# Patient Record
Sex: Female | Born: 1935
Health system: Southern US, Community
[De-identification: ages and names within clinical notes are randomized; demographics above are authoritative.]

## PROBLEM LIST (undated history)

## (undated) DIAGNOSIS — E119 Type 2 diabetes mellitus without complications: Secondary | ICD-10-CM

## (undated) DIAGNOSIS — I10 Essential (primary) hypertension: Secondary | ICD-10-CM

## (undated) DIAGNOSIS — E785 Hyperlipidemia, unspecified: Secondary | ICD-10-CM

## (undated) DIAGNOSIS — E669 Obesity, unspecified: Secondary | ICD-10-CM

## (undated) HISTORY — PX: BREAST SURGERY: SHX581

## (undated) HISTORY — PX: APPENDECTOMY: SHX54

## (undated) HISTORY — DX: Essential (primary) hypertension: I10

## (undated) HISTORY — DX: Type 2 diabetes mellitus without complications: E11.9

## (undated) HISTORY — DX: Obesity, unspecified: E66.9

## (undated) HISTORY — PX: CHOLECYSTECTOMY: SHX55

## (undated) HISTORY — DX: Hyperlipidemia, unspecified: E78.5

## (undated) HISTORY — PX: NEPHRECTOMY: SHX65

## (undated) HISTORY — PX: CATARACT EXTRACTION, BILATERAL: SHX1313

## (undated) HISTORY — PX: OOPHORECTOMY: SHX86

---

## 1967-09-16 HISTORY — PX: FRACTURE SURGERY: SHX138

## 1982-09-15 HISTORY — PX: ABDOMINAL HYSTERECTOMY: SHX81

## 1983-09-16 HISTORY — PX: BLADDER SURGERY: SHX569

## 1985-09-15 HISTORY — PX: CARPAL TUNNEL RELEASE: SHX101

## 1998-11-19 ENCOUNTER — Ambulatory Visit (HOSPITAL_COMMUNITY): Admission: RE | Admit: 1998-11-19 | Discharge: 1998-11-19 | Payer: Self-pay | Admitting: Obstetrics & Gynecology

## 2001-01-26 ENCOUNTER — Other Ambulatory Visit: Admission: RE | Admit: 2001-01-26 | Discharge: 2001-01-26 | Payer: Self-pay | Admitting: Family Medicine

## 2002-09-15 HISTORY — PX: JOINT REPLACEMENT: SHX530

## 2004-09-17 ENCOUNTER — Ambulatory Visit: Payer: Self-pay | Admitting: Unknown Physician Specialty

## 2004-12-02 ENCOUNTER — Ambulatory Visit: Payer: Self-pay | Admitting: Family Medicine

## 2005-07-03 ENCOUNTER — Encounter: Payer: Self-pay | Admitting: General Practice

## 2005-07-16 ENCOUNTER — Encounter: Payer: Self-pay | Admitting: General Practice

## 2006-01-12 ENCOUNTER — Ambulatory Visit: Payer: Self-pay | Admitting: General Practice

## 2007-01-25 ENCOUNTER — Ambulatory Visit: Payer: Self-pay | Admitting: Pain Medicine

## 2007-01-26 ENCOUNTER — Ambulatory Visit: Payer: Self-pay | Admitting: Pain Medicine

## 2007-01-27 ENCOUNTER — Ambulatory Visit: Payer: Self-pay | Admitting: Pain Medicine

## 2007-02-09 ENCOUNTER — Ambulatory Visit: Payer: Self-pay | Admitting: Pain Medicine

## 2007-03-09 ENCOUNTER — Ambulatory Visit: Payer: Self-pay | Admitting: Pain Medicine

## 2007-03-24 ENCOUNTER — Ambulatory Visit: Payer: Self-pay | Admitting: Physician Assistant

## 2007-05-06 ENCOUNTER — Ambulatory Visit: Payer: Self-pay | Admitting: Family Medicine

## 2007-05-11 ENCOUNTER — Ambulatory Visit: Payer: Self-pay | Admitting: Internal Medicine

## 2007-08-06 ENCOUNTER — Ambulatory Visit: Payer: Self-pay | Admitting: Family Medicine

## 2007-08-19 ENCOUNTER — Ambulatory Visit: Payer: Self-pay | Admitting: Family Medicine

## 2007-09-01 ENCOUNTER — Ambulatory Visit: Payer: Self-pay | Admitting: Family Medicine

## 2008-06-06 HISTORY — PX: HERNIA REPAIR: SHX51

## 2010-02-26 ENCOUNTER — Ambulatory Visit: Payer: Self-pay | Admitting: Unknown Physician Specialty

## 2010-07-29 ENCOUNTER — Ambulatory Visit: Payer: Self-pay | Admitting: Family Medicine

## 2010-10-16 HISTORY — PX: EYE SURGERY: SHX253

## 2011-01-28 NOTE — Assessment & Plan Note (Signed)
[facility]                             PULMONARY OFFICE NOTE   SERETHA, ESTABROOKS                        MRN:          [id number]  DATE:05/11/2007                            DOB:          1935-12-08    REASON FOR CONSULTATION:  Abnormal chest CT scan and chronic cough.   HISTORY:  A delightful 75 year old white female never smoker with a flu-  like illness in February 2008 and a persistent cough since then.  Initially she had a congested cough with thick mucus for which she  received antibiotics but improved but never completely better following  this illness.  She has been bothered by a persistent dry cough that is  present more during the day than night and is mostly dry.  She is also  bothered by right-sided posterior chest discomfort that is worse with  coughing but also positional in nature and feels like she might have  torn something.  This led to a chest x-ray, which led to a CT scan  showing bilateral infiltrates, and therefore I was asked to see her.   The patient denies any antecedent history of respiratory complaints.  She had been on ACE inhibitor but Dr. Sullivan Lone had already stopped this,  she thinks several months ago.  She denies any previous history of atopy  or asthma or chronic cough following respiratory illnesses, active sinus  or reflux symptoms.   PAST MEDICAL HISTORY:  1. Type 2 diabetes.  2. Remote cholecystectomy, hysterectomy and appendectomy as well as      several abdominal surgeries for benign purposes.   ALLERGIES:  None known.   MEDICATIONS:  Have included ACE inhibitors in the but presently only  include:   1. Ranitidine 150 mg b.i.d.  2. Naproxen 375 mg b.i.d.  3. Aspirin daily.  4. Lipitor.  5. Actos.   For a full inventory of dosing, please see face sheet column dated  May 11, 2007, correct as listed.   SOCIAL HISTORY:  She has never smoked.  She is a retired Occupational hygienist for  [employer].   FAMILY HISTORY:  Significant for cancer of the pancreas in her father  and prostate of her brother.   REVIEW OF SYSTEMS:  Taken in detail on the worksheet and negative except  as outlined above.   PHYSICAL EXAMINATION:  This is an ambulatory, obese white female with  mild voice fatigue.  She has stable vital signs.  HEENT:  Minimum turbinate edema, nonspecific edema.  Oropharynx is  clear.  No evidence of excessive postnasal drainage or cobblestoning.  Ear canals clear bilaterally.  NECK:  Supple without cervical adenopathy or tenderness.  Trachea is  midline.  No thyromegaly.  Lung fields perfectly clear bilaterally to auscultation and percussion.  When she took a deep breath she could feel the urge to cough in the  midportion of her neck at the level of the thyroid cartilage.  Lung  fields perfectly clear bilaterally to auscultation and percussion with  excellent air movement.  There is a regular rhythm without murmur, gallop, or rub.  ABDOMEN:  Obese, benign.  EXTREMITIES:  Warm without calf tenderness, cyanosis, clubbing or edema.   Chest x-ray and CT scans were available.  Chest x-ray shows on August 12  a suggestion of increased density overlying the right middle lobe, which  on CT scan corresponds to increased interstitial densities in the  anterior and medial segments of both the right middle lobe and lingula.  There are no pathologic nodes.   IMPRESSION:  A chronic upper airway cough has developed following a  upper respiratory infection and most likely is not directly related to  the findings on CT scan (see comments below).  That is, this is a dry  daytime cough, not associated with excess sputum production, fevers,  chills, sweats, or unintended weight loss, as might be seen in a right  middle lobe/lingular syndrome, which is most likely what she has.   To sort through the differential diagnosis of chronic cough, however, I  have recommended initially adherence to a  GERD diet along with Zantac  150 mg two q.h.s., omeprazole 40 mg q.a.m., and to suppress the dry,  cyclical cough with Delsym combined with tramadol 50 mg one every 4  hours.   If this eliminates the cough, then all I would do is see if there is  convincing evidence of evolving change on plain x-ray on her return  visit in 4 weeks.  If so, we could schedule her here for bronchoscopy  for lavage to rule out MAI, which would be the most likely explanation  for the above findings (even if she did have MAI, I am not sure it would  indicate that we wound necessarily need to begin treatment unless we  could be convinced of a cause and effect relationship between the  infiltrates and their symptoms).   I explained all this to the best of my ability to this very nice lady  and would be happy to see her back here to schedule bronchoscopy if  there are either persistent symptoms while on the above therapy or if  she has macroscopic evidence of evolving changes on serial chest x-ray.     Charlaine Dalton. Sherene Sires, MD, [facility]  Electronically Signed    MBW/MedQ  DD: 05/11/2007  DT: 05/12/2007  Job #: [id number]   cc:   Julieanne Manson

## 2011-04-02 ENCOUNTER — Ambulatory Visit: Payer: Self-pay | Admitting: Unknown Physician Specialty

## 2011-04-14 ENCOUNTER — Ambulatory Visit: Payer: Self-pay | Admitting: Unknown Physician Specialty

## 2011-09-22 ENCOUNTER — Emergency Department: Payer: Self-pay | Admitting: Emergency Medicine

## 2011-09-22 LAB — COMPREHENSIVE METABOLIC PANEL
Albumin: 4.5 g/dL (ref 3.4–5.0)
Anion Gap: 8 (ref 7–16)
BUN: 34 mg/dL — ABNORMAL HIGH (ref 7–18)
Calcium, Total: 9.4 mg/dL (ref 8.5–10.1)
Chloride: 107 mmol/L (ref 98–107)
Co2: 25 mmol/L (ref 21–32)
EGFR (African American): 34 — ABNORMAL LOW
EGFR (Non-African Amer.): 28 — ABNORMAL LOW
Glucose: 121 mg/dL — ABNORMAL HIGH (ref 65–99)
Osmolality: 288 (ref 275–301)
Potassium: 5 mmol/L (ref 3.5–5.1)
SGOT(AST): 17 U/L (ref 15–37)
Sodium: 140 mmol/L (ref 136–145)
Total Protein: 8.1 g/dL (ref 6.4–8.2)

## 2011-09-22 LAB — CBC
HGB: 12.6 g/dL (ref 12.0–16.0)
MCH: 32.3 pg (ref 26.0–34.0)
Platelet: 276 10*3/uL (ref 150–440)
RBC: 3.9 10*6/uL (ref 3.80–5.20)
RDW: 13.3 % (ref 11.5–14.5)

## 2011-09-22 LAB — PROTIME-INR
INR: 1
Prothrombin Time: 13.6 secs (ref 11.5–14.7)

## 2012-01-02 ENCOUNTER — Ambulatory Visit: Payer: Self-pay | Admitting: Family Medicine

## 2012-06-30 ENCOUNTER — Ambulatory Visit: Payer: Self-pay | Admitting: Family Medicine

## 2012-06-30 LAB — HM DEXA SCAN

## 2013-06-20 ENCOUNTER — Ambulatory Visit: Payer: Self-pay | Admitting: Unknown Physician Specialty

## 2013-06-20 LAB — HM COLONOSCOPY

## 2013-07-25 ENCOUNTER — Ambulatory Visit: Payer: Self-pay | Admitting: Physical Medicine and Rehabilitation

## 2014-01-16 LAB — HM MAMMOGRAPHY

## 2014-05-29 LAB — LIPID PANEL
CHOLESTEROL: 184 mg/dL (ref 0–200)
HDL: 57 mg/dL (ref 35–70)
LDL Cholesterol: 95 mg/dL
LDL/HDL RATIO: 1.7
Triglycerides: 162 mg/dL — AB (ref 40–160)

## 2014-05-29 LAB — CBC AND DIFFERENTIAL
HEMATOCRIT: 39 % (ref 36–46)
Hemoglobin: 13.2 g/dL (ref 12.0–16.0)
Neutrophils Absolute: 7 /uL
Platelets: 364 10*3/uL (ref 150–399)
WBC: 9.9 10*3/mL

## 2014-05-29 LAB — TSH: TSH: 2.82 u[IU]/mL (ref 0.41–5.90)

## 2014-05-29 LAB — BASIC METABOLIC PANEL
BUN: 44 mg/dL — AB (ref 4–21)
CREATININE: 1.9 mg/dL — AB (ref 0.5–1.1)
Glucose: 212 mg/dL
Potassium: 5.3 mmol/L (ref 3.4–5.3)
Sodium: 138 mmol/L (ref 137–147)

## 2014-05-29 LAB — HEPATIC FUNCTION PANEL
ALK PHOS: 65 U/L (ref 25–125)
ALT: 7 U/L (ref 7–35)
AST: 10 U/L — AB (ref 13–35)
Bilirubin, Total: 0.9 mg/dL

## 2015-01-01 LAB — HEMOGLOBIN A1C: Hgb A1c MFr Bld: 6.6 % — AB (ref 4.0–6.0)

## 2015-02-19 DIAGNOSIS — N399 Disorder of urinary system, unspecified: Secondary | ICD-10-CM | POA: Insufficient documentation

## 2015-02-19 DIAGNOSIS — R51 Headache: Secondary | ICD-10-CM

## 2015-02-19 DIAGNOSIS — I8390 Asymptomatic varicose veins of unspecified lower extremity: Secondary | ICD-10-CM | POA: Insufficient documentation

## 2015-02-19 DIAGNOSIS — G510 Bell's palsy: Secondary | ICD-10-CM | POA: Insufficient documentation

## 2015-02-19 DIAGNOSIS — M5136 Other intervertebral disc degeneration, lumbar region: Secondary | ICD-10-CM | POA: Insufficient documentation

## 2015-02-19 DIAGNOSIS — M1990 Unspecified osteoarthritis, unspecified site: Secondary | ICD-10-CM | POA: Insufficient documentation

## 2015-02-19 DIAGNOSIS — I776 Arteritis, unspecified: Secondary | ICD-10-CM | POA: Insufficient documentation

## 2015-02-19 DIAGNOSIS — K1120 Sialoadenitis, unspecified: Secondary | ICD-10-CM | POA: Insufficient documentation

## 2015-02-19 DIAGNOSIS — I872 Venous insufficiency (chronic) (peripheral): Secondary | ICD-10-CM | POA: Insufficient documentation

## 2015-02-19 DIAGNOSIS — C649 Malignant neoplasm of unspecified kidney, except renal pelvis: Secondary | ICD-10-CM | POA: Insufficient documentation

## 2015-02-19 DIAGNOSIS — R519 Headache, unspecified: Secondary | ICD-10-CM | POA: Insufficient documentation

## 2015-02-19 DIAGNOSIS — M48061 Spinal stenosis, lumbar region without neurogenic claudication: Secondary | ICD-10-CM | POA: Insufficient documentation

## 2015-02-19 DIAGNOSIS — I130 Hypertensive heart and chronic kidney disease with heart failure and stage 1 through stage 4 chronic kidney disease, or unspecified chronic kidney disease: Secondary | ICD-10-CM | POA: Insufficient documentation

## 2015-02-19 DIAGNOSIS — E1142 Type 2 diabetes mellitus with diabetic polyneuropathy: Secondary | ICD-10-CM | POA: Insufficient documentation

## 2015-02-19 DIAGNOSIS — R600 Localized edema: Secondary | ICD-10-CM | POA: Insufficient documentation

## 2015-02-19 DIAGNOSIS — E1140 Type 2 diabetes mellitus with diabetic neuropathy, unspecified: Secondary | ICD-10-CM | POA: Insufficient documentation

## 2015-02-19 DIAGNOSIS — Z86010 Personal history of colonic polyps: Secondary | ICD-10-CM | POA: Insufficient documentation

## 2015-02-19 DIAGNOSIS — E559 Vitamin D deficiency, unspecified: Secondary | ICD-10-CM | POA: Insufficient documentation

## 2015-02-19 DIAGNOSIS — M5116 Intervertebral disc disorders with radiculopathy, lumbar region: Secondary | ICD-10-CM | POA: Insufficient documentation

## 2015-02-19 DIAGNOSIS — N289 Disorder of kidney and ureter, unspecified: Secondary | ICD-10-CM | POA: Insufficient documentation

## 2015-02-19 DIAGNOSIS — E039 Hypothyroidism, unspecified: Secondary | ICD-10-CM | POA: Insufficient documentation

## 2015-02-19 DIAGNOSIS — E785 Hyperlipidemia, unspecified: Secondary | ICD-10-CM | POA: Insufficient documentation

## 2015-02-19 DIAGNOSIS — E669 Obesity, unspecified: Secondary | ICD-10-CM | POA: Insufficient documentation

## 2015-02-19 DIAGNOSIS — K219 Gastro-esophageal reflux disease without esophagitis: Secondary | ICD-10-CM | POA: Insufficient documentation

## 2015-02-19 DIAGNOSIS — N6019 Diffuse cystic mastopathy of unspecified breast: Secondary | ICD-10-CM | POA: Insufficient documentation

## 2015-04-30 ENCOUNTER — Other Ambulatory Visit: Payer: Self-pay | Admitting: Family Medicine

## 2015-05-02 ENCOUNTER — Ambulatory Visit: Payer: Self-pay | Admitting: Family Medicine

## 2015-05-07 ENCOUNTER — Other Ambulatory Visit: Payer: Self-pay | Admitting: Physician Assistant

## 2015-05-07 DIAGNOSIS — M75102 Unspecified rotator cuff tear or rupture of left shoulder, not specified as traumatic: Secondary | ICD-10-CM

## 2015-05-09 ENCOUNTER — Ambulatory Visit (INDEPENDENT_AMBULATORY_CARE_PROVIDER_SITE_OTHER): Payer: Medicare Other | Admitting: Family Medicine

## 2015-05-09 ENCOUNTER — Encounter: Payer: Self-pay | Admitting: Family Medicine

## 2015-05-09 VITALS — BP 124/62 | HR 78 | Temp 97.9°F | Resp 16 | Wt 220.0 lb

## 2015-05-09 DIAGNOSIS — N189 Chronic kidney disease, unspecified: Secondary | ICD-10-CM

## 2015-05-09 DIAGNOSIS — E1142 Type 2 diabetes mellitus with diabetic polyneuropathy: Secondary | ICD-10-CM

## 2015-05-09 DIAGNOSIS — E039 Hypothyroidism, unspecified: Secondary | ICD-10-CM | POA: Diagnosis not present

## 2015-05-09 DIAGNOSIS — G629 Polyneuropathy, unspecified: Secondary | ICD-10-CM | POA: Diagnosis not present

## 2015-05-09 DIAGNOSIS — I130 Hypertensive heart and chronic kidney disease with heart failure and stage 1 through stage 4 chronic kidney disease, or unspecified chronic kidney disease: Secondary | ICD-10-CM | POA: Diagnosis not present

## 2015-05-09 DIAGNOSIS — I509 Heart failure, unspecified: Secondary | ICD-10-CM

## 2015-05-09 DIAGNOSIS — E119 Type 2 diabetes mellitus without complications: Secondary | ICD-10-CM | POA: Insufficient documentation

## 2015-05-09 DIAGNOSIS — E1121 Type 2 diabetes mellitus with diabetic nephropathy: Secondary | ICD-10-CM

## 2015-05-09 LAB — POCT GLYCOSYLATED HEMOGLOBIN (HGB A1C): HEMOGLOBIN A1C: 7.2

## 2015-05-09 NOTE — Progress Notes (Signed)
Patient ID: Gina Michael, female   DOB: [birth year], 79 y.o.   MRN: [id number]    Subjective:  HPI  Diabetes Mellitus Type II, Follow-up:   Lab Results  Component Value Date   HGBA1C 6.6* 01/01/2015    Last seen for diabetes 4 months ago.  Management changes included none. She reports good compliance with treatment. She is not having side effects.  Current symptoms include none Home blood sugar records: 116-118 fasting  Episodes of hypoglycemia? no   Current Insulin Regimen: n/a Most Recent Eye Exam Current exercise: yard work and walking about 15 minutes 2 times a day, 5 days a week  Pertinent Labs:    Component Value Date/Time   CHOL 184 05/29/2014   TRIG 162* 05/29/2014   CREATININE 1.9* 05/29/2014   CREATININE 1.85* 09/22/2011 1937    Wt Readings from Last 3 Encounters:  05/09/15 220 lb (99.791 kg)  01/01/15 216 lb (97.977 kg)    ------------------------------------------------------------------------    Hypertension, follow-up:  BP Readings from Last 3 Encounters:  05/09/15 124/62  01/01/15 128/78    She was last seen for hypertension 4 months ago.  BP at that visit was 128/78. Management changes since that visit include none. She reports good compliance with treatment. She is not having side effects.  She is exercising.  Wt Readings from Last 3 Encounters:  05/09/15 220 lb (99.791 kg)  01/01/15 216 lb (97.977 kg)   ------------------------------------------------------------------------     Prior to Admission medications   Medication Sig Start Date End Date Taking? Authorizing Provider  aspirin 81 MG EC tablet Take by mouth. 03/06/11  Yes Historical Provider, MD  atorvastatin (LIPITOR) 10 MG tablet Take by mouth. 12/27/14  Yes Historical Provider, MD  Cholecalciferol 1000 UNITS TBDP Take by mouth.   Yes Historical Provider, MD  Docusate Sodium 100 MG capsule Take by mouth.   Yes Historical Provider, MD  glimepiride (AMARYL) 4 MG tablet Take  by mouth. 02/07/15  Yes Historical Provider, MD  levothyroxine (SYNTHROID, LEVOTHROID) 88 MCG tablet TAKE ONE TABLET EVERY DAY 04/30/15  Yes Jerrol Banana., MD  losartan (COZAAR) 50 MG tablet Take by mouth. 08/14/14  Yes Historical Provider, MD  Omeprazole 20 MG TBEC Take by mouth. 06/11/11  Yes Historical Provider, MD  pioglitazone (ACTOS) 15 MG tablet Take by mouth. 07/19/14  Yes Historical Provider, MD  ranitidine (ZANTAC) 300 MG capsule Take by mouth. 09/12/13  Yes Historical Provider, MD  sodium bicarbonate 650 MG tablet Take by mouth.   Yes Historical Provider, MD    Patient Active Problem List   Diagnosis Date Noted  . Diabetes 05/09/2015  . Bell palsy 02/19/2015  . Chronic venous insufficiency 02/19/2015  . DDD (degenerative disc disease), lumbar 02/19/2015  . Diabetic neuropathy 02/19/2015  . Urinary system disease 02/19/2015  . Edema extremities 02/19/2015  . Bloodgood disease 02/19/2015  . Acid reflux 02/19/2015  . Cephalalgia 02/19/2015  . H/O adenomatous polyp of colon 02/19/2015  . HLD (hyperlipidemia) 02/19/2015  . Heart & renal disease, hypertensive, with heart failure 02/19/2015  . Adult hypothyroidism 02/19/2015  . Neuritis or radiculitis due to rupture of lumbar intervertebral disc 02/19/2015  . Lumbar canal stenosis 02/19/2015  . Cancer of kidney 02/19/2015  . Adiposity 02/19/2015  . Arthritis, degenerative 02/19/2015  . Impaired renal function 02/19/2015  . Sialoadenitis 02/19/2015  . Diabetic peripheral neuropathy associated with type 2 diabetes mellitus 02/19/2015  . Phlebectasia 02/19/2015  . Angiitis 02/19/2015  . Avitaminosis D 02/19/2015  No past medical history on file.  Social History   Social History  . Marital Status: Married    Spouse Name: N/A  . Number of Children: N/A  . Years of Education: N/A   Occupational History  . Not on file.   Social History Main Topics  . Smoking status: Never Smoker   . Smokeless tobacco: Not on  file  . Alcohol Use: No  . Drug Use: No  . Sexual Activity: Not on file   Other Topics Concern  . Not on file   Social History Narrative    Allergies  Allergen Reactions  . Augmentin  [Amoxicillin-Pot Clavulanate] Anaphylaxis  . Cleocin  [Clindamycin] Anaphylaxis  . Penicillins Anaphylaxis  . Cefprozil   . Codeine   . Citric Acid Sodium  [Sodium Citrate]   . Vancomycin Hcl   . Oxycodone Rash    Review of Systems  Constitutional: Negative.   HENT: Negative.   Eyes: Negative.   Respiratory: Negative.   Cardiovascular: Negative.   Gastrointestinal: Negative.   Genitourinary: Negative.   Musculoskeletal: Positive for joint pain.  Skin: Negative.   Neurological: Negative.   Endo/Heme/Allergies: Negative.   Psychiatric/Behavioral: Negative.     Immunization History  Administered Date(s) Administered  . Pneumococcal Conjugate-13 01/30/2014  . Pneumococcal Polysaccharide-23 07/27/2000, 07/23/2005  . Td 08/04/2007  . Zoster 10/31/2008   Objective:  BP 124/62 mmHg  Pulse 78  Temp(Src) 97.9 F (36.6 C) (Oral)  Resp 16  Wt 220 lb (99.791 kg)  Physical Exam  Constitutional: She is oriented to person, place, and time and well-developed, well-nourished, and in no distress.  HENT:  Head: Normocephalic and atraumatic.  Right Ear: External ear normal.  Left Ear: External ear normal.  Nose: Nose normal.  Eyes: Conjunctivae are normal.  Neck: Neck supple.  Cardiovascular: Normal rate, regular rhythm and normal heart sounds.   Pulmonary/Chest: Effort normal and breath sounds normal.  Abdominal: Soft.  Musculoskeletal:  Decreased range of motion left shoulder due to pain  Neurological: She is alert and oriented to person, place, and time.  Skin: Skin is warm and dry.  Psychiatric: Mood, memory, affect and judgment normal.    Lab Results  Component Value Date   WBC 9.9 05/29/2014   HGB 13.2 05/29/2014   HCT 39 05/29/2014   PLT 364 05/29/2014   GLUCOSE 121*  09/22/2011   CHOL 184 05/29/2014   TRIG 162* 05/29/2014   HDL 57 05/29/2014   LDLCALC 95 05/29/2014   TSH 2.82 05/29/2014   INR 1.0 09/22/2011   HGBA1C 6.6* 01/01/2015    CMP     Component Value Date/Time   NA 138 05/29/2014   NA 140 09/22/2011 1937   K 5.3 05/29/2014   K 5.0 09/22/2011 1937   CL 107 09/22/2011 1937   CO2 25 09/22/2011 1937   GLUCOSE 121* 09/22/2011 1937   BUN 44* 05/29/2014   BUN 34* 09/22/2011 1937   CREATININE 1.9* 05/29/2014   CREATININE 1.85* 09/22/2011 1937   CALCIUM 9.4 09/22/2011 1937   PROT 8.1 09/22/2011 1937   ALBUMIN 4.5 09/22/2011 1937   AST 10* 05/29/2014   AST 17 09/22/2011 1937   ALT 7 05/29/2014   ALT 18 09/22/2011 1937   ALKPHOS 65 05/29/2014   ALKPHOS 64 09/22/2011 1937   BILITOT 0.8 09/22/2011 1937   GFRNONAA 28* 09/22/2011 1937   GFRAA 34* 09/22/2011 1937    Assessment and Plan :  Type 2 diabetes Good control with A1c of 7.2  goal A1c of 7.5 now. Told her when she turns 82 goal A1c will be 8. He is having no hypoglycemia. Hypertension Diabetic nephropathy Followed by nephrology Rotator cuff syndrome Seeing orthopedics, MRI this week. Hyperlipidemia Hypothyroidism Obesity I have done the exam and reviewed the above chart and it is accurate to the best of my knowledge.   Miguel Aschoff MD [facility] 05/09/2015 2:46 PM

## 2015-05-11 ENCOUNTER — Ambulatory Visit
Admission: RE | Admit: 2015-05-11 | Discharge: 2015-05-11 | Disposition: A | Discharge status: Home / Self Care | Payer: Medicare Other | Source: Ambulatory Visit | Attending: Physician Assistant | Admitting: Physician Assistant

## 2015-05-11 DIAGNOSIS — M19012 Primary osteoarthritis, left shoulder: Secondary | ICD-10-CM | POA: Insufficient documentation

## 2015-05-11 DIAGNOSIS — M7552 Bursitis of left shoulder: Secondary | ICD-10-CM | POA: Insufficient documentation

## 2015-05-11 DIAGNOSIS — M75102 Unspecified rotator cuff tear or rupture of left shoulder, not specified as traumatic: Secondary | ICD-10-CM

## 2015-05-11 DIAGNOSIS — M7542 Impingement syndrome of left shoulder: Secondary | ICD-10-CM | POA: Diagnosis present

## 2015-05-11 DIAGNOSIS — M7592 Shoulder lesion, unspecified, left shoulder: Secondary | ICD-10-CM | POA: Insufficient documentation

## 2015-06-12 ENCOUNTER — Other Ambulatory Visit: Payer: Self-pay | Admitting: Family Medicine

## 2015-06-12 NOTE — Telephone Encounter (Signed)
Patient seen Dr. Koleen Nimrod for dermatology.  3 years ago she was seen for a rash (which was undetermined exactly what it was).  He gave her Triamcinolone 0.1% for the rash.  She said the rash is back and needs refills on this.   Please call to [facility].    Let the patient know if you will do this.

## 2015-06-14 MED ORDER — TRIAMCINOLONE ACETONIDE 0.1 % EX CREA: 1.0000 "application " | TOPICAL_CREAM | Freq: Two times a day (BID) | CUTANEOUS | Status: DC

## 2015-06-14 NOTE — Telephone Encounter (Signed)
Okay to refill one time, 30 g tube with no refills

## 2015-06-14 NOTE — Telephone Encounter (Signed)
Done-aa 

## 2015-07-04 ENCOUNTER — Ambulatory Visit (INDEPENDENT_AMBULATORY_CARE_PROVIDER_SITE_OTHER): Payer: Medicare Other

## 2015-07-04 DIAGNOSIS — Z23 Encounter for immunization: Secondary | ICD-10-CM

## 2015-07-09 ENCOUNTER — Other Ambulatory Visit: Payer: Self-pay | Admitting: Family Medicine

## 2015-09-05 ENCOUNTER — Encounter: Payer: Self-pay | Admitting: Family Medicine

## 2015-09-05 ENCOUNTER — Ambulatory Visit: Payer: Medicare Other | Admitting: Family Medicine

## 2015-09-05 ENCOUNTER — Ambulatory Visit (INDEPENDENT_AMBULATORY_CARE_PROVIDER_SITE_OTHER): Payer: Medicare Other | Admitting: Family Medicine

## 2015-09-05 VITALS — BP 138/80 | HR 78 | Temp 97.7°F | Resp 16 | Wt 217.0 lb

## 2015-09-05 DIAGNOSIS — E039 Hypothyroidism, unspecified: Secondary | ICD-10-CM | POA: Diagnosis not present

## 2015-09-05 DIAGNOSIS — M791 Myalgia, unspecified site: Secondary | ICD-10-CM

## 2015-09-05 DIAGNOSIS — E1121 Type 2 diabetes mellitus with diabetic nephropathy: Secondary | ICD-10-CM

## 2015-09-05 DIAGNOSIS — M1990 Unspecified osteoarthritis, unspecified site: Secondary | ICD-10-CM

## 2015-09-05 DIAGNOSIS — E785 Hyperlipidemia, unspecified: Secondary | ICD-10-CM | POA: Diagnosis not present

## 2015-09-05 LAB — POCT GLYCOSYLATED HEMOGLOBIN (HGB A1C): HEMOGLOBIN A1C: 6.5

## 2015-09-05 NOTE — Progress Notes (Signed)
Patient ID: Gina Michael, female   DOB: Dec 09, 1935, 79 y.o.   MRN: [id number]    Subjective:  HPI  Diabetes Mellitus Type II, Follow-up:   Lab Results  Component Value Date   HGBA1C 7.2 05/09/2015   HGBA1C 6.6* 01/01/2015    Last seen for diabetes 4 months ago.  Management since then includes none She reports good compliance with treatment. She is not having side effects.  Current symptoms include none. Home blood sugar records: 100-120's fasting  Episodes of hypoglycemia? 1, shortly after her last OV.    Current Insulin Regimen: n/a Most Recent Eye Exam: about a year ago Current exercise: none  Pertinent Labs:    Component Value Date/Time   CHOL 184 05/29/2014   TRIG 162* 05/29/2014   HDL 57 05/29/2014   LDLCALC 95 05/29/2014   CREATININE 1.9* 05/29/2014   CREATININE 1.85* 09/22/2011 1937    Wt Readings from Last 3 Encounters:  09/05/15 217 lb (98.431 kg)  05/09/15 220 lb (99.791 kg)  01/01/15 216 lb (97.977 kg)    ------------------------------------------------------------------------  Pt is having hair loss and wants to get her TSH checked. She is also having joint pain. She says it started in her shoulders then her knees and ankles. She has been seen by Ortho for this and was given Cortisone injections in her shoulders. She had 3 of them and the last one short her blood sugars up and gave her a rash, so she does not want to take them anymore. She wants to know if she could get some pain medication that she has had int he past for her back surgery. She also wants to know if she can get a handicapped sticker because of all this joint pain. She did also say that she is taking her Atorvastatin daily.    Prior to Admission medications   Medication Sig Start Date End Date Taking? Authorizing Provider  aspirin 81 MG EC tablet Take by mouth. 03/06/11  Yes Historical Provider, MD  atorvastatin (LIPITOR) 10 MG tablet Take by mouth. 12/27/14  Yes Historical Provider, MD    BIOTIN PO Take by mouth daily.   Yes Historical Provider, MD  Cholecalciferol 1000 UNITS TBDP Take by mouth.   Yes Historical Provider, MD  Docusate Sodium 100 MG capsule Take by mouth.   Yes Historical Provider, MD  glimepiride (AMARYL) 4 MG tablet Take by mouth. 02/07/15  Yes Historical Provider, MD  levothyroxine (SYNTHROID, LEVOTHROID) 88 MCG tablet TAKE ONE TABLET EVERY DAY 04/30/15  Yes Jerrol Banana., MD  losartan (COZAAR) 50 MG tablet Take by mouth. 08/14/14  Yes Historical Provider, MD  Omeprazole 20 MG TBEC Take by mouth. 06/11/11  Yes Historical Provider, MD  pioglitazone (ACTOS) 15 MG tablet TAKE ONE TABLET BY MOUTH EVERY MORNING 07/09/15  Yes Jerrol Banana., MD  ranitidine (ZANTAC) 300 MG capsule Take by mouth. 09/12/13  Yes Historical Provider, MD  sodium bicarbonate 650 MG tablet Take by mouth.   Yes Historical Provider, MD  triamcinolone cream (KENALOG) 0.1 % Apply 1 application topically 2 (two) times daily. 06/14/15  Yes Richard Maceo Pro., MD    Patient Active Problem List   Diagnosis Date Noted  . Diabetes ([place]) 05/09/2015  . Bell palsy 02/19/2015  . Chronic venous insufficiency 02/19/2015  . DDD (degenerative disc disease), lumbar 02/19/2015  . Diabetic neuropathy ([place]) 02/19/2015  . Urinary system disease 02/19/2015  . Edema extremities 02/19/2015  . Bloodgood disease 02/19/2015  . Acid  reflux 02/19/2015  . Cephalalgia 02/19/2015  . H/O adenomatous polyp of colon 02/19/2015  . HLD (hyperlipidemia) 02/19/2015  . Heart & renal disease, hypertensive, with heart failure ([place]) 02/19/2015  . Adult hypothyroidism 02/19/2015  . Neuritis or radiculitis due to rupture of lumbar intervertebral disc 02/19/2015  . Lumbar canal stenosis 02/19/2015  . Cancer of kidney ([place]) 02/19/2015  . Adiposity 02/19/2015  . Arthritis, degenerative 02/19/2015  . Impaired renal function 02/19/2015  . Sialoadenitis 02/19/2015  . Diabetic peripheral neuropathy associated with  type 2 diabetes mellitus ([place]) 02/19/2015  . Phlebectasia 02/19/2015  . Angiitis ([place]) 02/19/2015  . Avitaminosis D 02/19/2015    History reviewed. No pertinent past medical history.  Social History   Social History  . Marital Status: Married    Spouse Name: N/A  . Number of Children: N/A  . Years of Education: N/A   Occupational History  . Not on file.   Social History Main Topics  . Smoking status: Never Smoker   . Smokeless tobacco: Not on file  . Alcohol Use: No  . Drug Use: No  . Sexual Activity: Not on file   Other Topics Concern  . Not on file   Social History Narrative    Allergies  Allergen Reactions  . Augmentin  [Amoxicillin-Pot Clavulanate] Anaphylaxis  . Cleocin  [Clindamycin] Anaphylaxis  . Penicillins Anaphylaxis  . Cefprozil   . Codeine   . Citric Acid Sodium  [Sodium Citrate]   . Vancomycin Hcl   . Oxycodone Rash    Review of Systems  Constitutional: Negative.   HENT: Negative.   Eyes: Negative.   Respiratory: Negative.   Cardiovascular: Negative.   Gastrointestinal: Negative.   Musculoskeletal: Positive for myalgias and joint pain.  Skin: Negative.   Neurological: Negative.   Endo/Heme/Allergies: Negative.        Hair loss  Psychiatric/Behavioral: Negative.     Immunization History  Administered Date(s) Administered  . Influenza, High Dose Seasonal PF 07/04/2015  . Pneumococcal Conjugate-13 01/30/2014  . Pneumococcal Polysaccharide-23 07/27/2000, 07/23/2005  . Td 08/04/2007  . Zoster 10/31/2008   Objective:  BP 138/80 mmHg  Pulse 78  Temp(Src) 97.7 F (36.5 C) (Oral)  Resp 16  Wt 217 lb (98.431 kg)  Physical Exam  Constitutional: She is oriented to person, place, and time and well-developed, well-nourished, and in no distress.  HENT:  Head: Normocephalic and atraumatic.  Right Ear: External ear normal.  Left Ear: External ear normal.  Nose: Nose normal.  Eyes: Conjunctivae are normal.  Neck: Neck supple.    Cardiovascular: Normal rate, regular rhythm and normal heart sounds.   Pulmonary/Chest: Effort normal and breath sounds normal.  Abdominal: Soft.  Neurological: She is alert and oriented to person, place, and time. Gait normal.  Skin: Skin is warm and dry.  Psychiatric: Mood, memory and affect normal.    Lab Results  Component Value Date   WBC 9.9 05/29/2014   HGB 13.2 05/29/2014   HCT 39 05/29/2014   PLT 364 05/29/2014   GLUCOSE 121* 09/22/2011   CHOL 184 05/29/2014   TRIG 162* 05/29/2014   HDL 57 05/29/2014   LDLCALC 95 05/29/2014   TSH 2.82 05/29/2014   INR 1.0 09/22/2011   HGBA1C 7.2 05/09/2015    CMP     Component Value Date/Time   NA 138 05/29/2014   NA 140 09/22/2011 1937   K 5.3 05/29/2014   K 5.0 09/22/2011 1937   CL 107 09/22/2011 1937   CO2 25  09/22/2011 1937   GLUCOSE 121* 09/22/2011 1937   BUN 44* 05/29/2014   BUN 34* 09/22/2011 1937   CREATININE 1.9* 05/29/2014   CREATININE 1.85* 09/22/2011 1937   CALCIUM 9.4 09/22/2011 1937   PROT 8.1 09/22/2011 1937   ALBUMIN 4.5 09/22/2011 1937   AST 10* 05/29/2014   AST 17 09/22/2011 1937   ALT 7 05/29/2014   ALT 18 09/22/2011 1937   ALKPHOS 65 05/29/2014   ALKPHOS 64 09/22/2011 1937   BILITOT 0.8 09/22/2011 1937   GFRNONAA 28* 09/22/2011 1937   GFRAA 34* 09/22/2011 1937    Assessment and Plan :  1. Type 2 diabetes mellitus with diabetic nephropathy, without long-term current use of insulin (HCC)  - POCT HgB A1C-6.5 today - Comprehensive metabolic panel  2. Osteoarthritis, unspecified osteoarthritis type, unspecified site  - CBC with Differential/Platelet  3. HLD (hyperlipidemia)  - Lipid Panel With LDL/HDL Ratio - Comprehensive metabolic panel  4. Hypothyroidism, unspecified hypothyroidism type  - TSH  5. Myalgia Stop statin and follow up.  - CK  I have done the exam and reviewed the above chart and it is accurate to the best of my knowledge.  Patient was seen and examined by Dr.  Miguel Aschoff, and noted scribed by Webb Laws, [place] MD [facility] 09/05/2015 10:00 AM

## 2015-09-07 ENCOUNTER — Other Ambulatory Visit: Payer: Self-pay | Admitting: Family Medicine

## 2015-09-07 DIAGNOSIS — I130 Hypertensive heart and chronic kidney disease with heart failure and stage 1 through stage 4 chronic kidney disease, or unspecified chronic kidney disease: Secondary | ICD-10-CM

## 2015-09-07 LAB — COMPREHENSIVE METABOLIC PANEL
A/G RATIO: 1.9 (ref 1.1–2.5)
ALBUMIN: 4.3 g/dL (ref 3.5–4.8)
ALK PHOS: 49 IU/L (ref 39–117)
ALT: 8 IU/L (ref 0–32)
AST: 12 IU/L (ref 0–40)
BILIRUBIN TOTAL: 0.8 mg/dL (ref 0.0–1.2)
BUN / CREAT RATIO: 25 (ref 11–26)
BUN: 40 mg/dL — AB (ref 8–27)
CHLORIDE: 102 mmol/L (ref 96–106)
CO2: 21 mmol/L (ref 18–29)
Calcium: 9.6 mg/dL (ref 8.7–10.3)
Creatinine, Ser: 1.63 mg/dL — ABNORMAL HIGH (ref 0.57–1.00)
GFR calc non Af Amer: 30 mL/min/{1.73_m2} — ABNORMAL LOW (ref >59)
GFR, EST AFRICAN AMERICAN: 35 mL/min/{1.73_m2} — AB (ref >59)
GLUCOSE: 134 mg/dL — AB (ref 65–99)
Globulin, Total: 2.3 g/dL (ref 1.5–4.5)
POTASSIUM: 4.9 mmol/L (ref 3.5–5.2)
Sodium: 139 mmol/L (ref 134–144)
TOTAL PROTEIN: 6.6 g/dL (ref 6.0–8.5)

## 2015-09-07 LAB — CBC WITH DIFFERENTIAL/PLATELET
BASOS: 0 %
Basophils Absolute: 0 10*3/uL (ref 0.0–0.2)
EOS (ABSOLUTE): 0.2 10*3/uL (ref 0.0–0.4)
EOS: 3 %
HEMOGLOBIN: 11.6 g/dL (ref 11.1–15.9)
Hematocrit: 34.4 % (ref 34.0–46.6)
IMMATURE GRANS (ABS): 0 10*3/uL (ref 0.0–0.1)
IMMATURE GRANULOCYTES: 0 %
LYMPHS: 19 %
Lymphocytes Absolute: 1.4 10*3/uL (ref 0.7–3.1)
MCH: 31.9 pg (ref 26.6–33.0)
MCHC: 33.7 g/dL (ref 31.5–35.7)
MCV: 95 fL (ref 79–97)
MONOCYTES: 7 %
Monocytes Absolute: 0.6 10*3/uL (ref 0.1–0.9)
NEUTROS ABS: 5.2 10*3/uL (ref 1.4–7.0)
NEUTROS PCT: 71 %
Platelets: 314 10*3/uL (ref 150–379)
RBC: 3.64 x10E6/uL — ABNORMAL LOW (ref 3.77–5.28)
RDW: 12.6 % (ref 12.3–15.4)
WBC: 7.5 10*3/uL (ref 3.4–10.8)

## 2015-09-07 LAB — LIPID PANEL WITH LDL/HDL RATIO
CHOLESTEROL TOTAL: 145 mg/dL (ref 100–199)
HDL: 73 mg/dL (ref >39)
LDL CALC: 57 mg/dL (ref 0–99)
LDl/HDL Ratio: 0.8 ratio units (ref 0.0–3.2)
Triglycerides: 76 mg/dL (ref 0–149)
VLDL CHOLESTEROL CAL: 15 mg/dL (ref 5–40)

## 2015-09-07 LAB — TSH: TSH: 2.53 u[IU]/mL (ref 0.450–4.500)

## 2015-09-07 LAB — CK: CK TOTAL: 91 U/L (ref 24–173)

## 2015-11-06 ENCOUNTER — Ambulatory Visit (INDEPENDENT_AMBULATORY_CARE_PROVIDER_SITE_OTHER): Payer: Medicare HMO | Admitting: Family Medicine

## 2015-11-06 ENCOUNTER — Encounter: Payer: Self-pay | Admitting: Family Medicine

## 2015-11-06 VITALS — BP 132/74 | HR 86 | Temp 98.4°F | Resp 14 | Wt 219.0 lb

## 2015-11-06 DIAGNOSIS — E039 Hypothyroidism, unspecified: Secondary | ICD-10-CM

## 2015-11-06 DIAGNOSIS — R05 Cough: Secondary | ICD-10-CM | POA: Diagnosis not present

## 2015-11-06 DIAGNOSIS — L659 Nonscarring hair loss, unspecified: Secondary | ICD-10-CM | POA: Diagnosis not present

## 2015-11-06 DIAGNOSIS — E785 Hyperlipidemia, unspecified: Secondary | ICD-10-CM | POA: Diagnosis not present

## 2015-11-06 DIAGNOSIS — J40 Bronchitis, not specified as acute or chronic: Secondary | ICD-10-CM | POA: Diagnosis not present

## 2015-11-06 DIAGNOSIS — M791 Myalgia, unspecified site: Secondary | ICD-10-CM

## 2015-11-06 DIAGNOSIS — R059 Cough, unspecified: Secondary | ICD-10-CM

## 2015-11-06 MED ORDER — DOXYCYCLINE HYCLATE 100 MG PO TABS: 100.0000 mg | ORAL_TABLET | Freq: Two times a day (BID) | ORAL | Status: DC

## 2015-11-06 NOTE — Progress Notes (Signed)
Patient ID: Gina Michael, female   DOB: Nov 01, 1935, 80 y.o.   MRN: [id number]    Subjective:  HPI  Patient is here for 2 months follow up.   Myalgia: Patient stopped Lipitor on December 22nd 2016. She felt no difference until the past few days she woke up and her pains were much better, swelling in her ankles went down also. Lab Results  Component Value Date   CHOL 145 09/06/2015   HDL 73 09/06/2015   LDLCALC 57 09/06/2015   TRIG 76 09/06/2015   On the medication  Also her husband went to Dr. Gilford Rile yesterday and was started on antibiotics, he has flu like symptoms. Patient started to not feel yesterday. Symptoms include so far only headache, sore throat, dry cough.  Prior to Admission medications   Medication Sig Start Date End Date Taking? Authorizing Provider  aspirin 81 MG EC tablet Take by mouth. 03/06/11  Yes Historical Provider, MD  BIOTIN PO Take by mouth daily.   Yes Historical Provider, MD  Cholecalciferol 1000 UNITS TBDP Take by mouth.   Yes Historical Provider, MD  Docusate Sodium 100 MG capsule Take by mouth.   Yes Historical Provider, MD  glimepiride (AMARYL) 4 MG tablet Take by mouth. 02/07/15  Yes Historical Provider, MD  levothyroxine (SYNTHROID, LEVOTHROID) 88 MCG tablet TAKE ONE TABLET EVERY DAY 04/30/15  Yes Jerrol Banana., MD  losartan (COZAAR) 50 MG tablet TAKE ONE TABLET BY MOUTH EVERY DAY 09/12/15  Yes Jerrol Banana., MD  Omeprazole 20 MG TBEC Take by mouth. 06/11/11  Yes Historical Provider, MD  pioglitazone (ACTOS) 15 MG tablet TAKE ONE TABLET BY MOUTH EVERY MORNING 07/09/15  Yes Jerrol Banana., MD  ranitidine (ZANTAC) 300 MG capsule Take by mouth. 09/12/13  Yes Historical Provider, MD  sodium bicarbonate 650 MG tablet Take by mouth.   Yes Historical Provider, MD  triamcinolone cream (KENALOG) 0.1 % Apply 1 application topically 2 (two) times daily. 06/14/15  Yes Richard Maceo Pro., MD  atorvastatin (LIPITOR) 10 MG tablet Take by mouth.  Reported on 11/06/2015 12/27/14   Historical Provider, MD    Patient Active Problem List   Diagnosis Date Noted  . Diabetes ([place]) 05/09/2015  . Bell palsy 02/19/2015  . Chronic venous insufficiency 02/19/2015  . DDD (degenerative disc disease), lumbar 02/19/2015  . Diabetic neuropathy ([place]) 02/19/2015  . Urinary system disease 02/19/2015  . Edema extremities 02/19/2015  . Bloodgood disease 02/19/2015  . Acid reflux 02/19/2015  . Cephalalgia 02/19/2015  . H/O adenomatous polyp of colon 02/19/2015  . HLD (hyperlipidemia) 02/19/2015  . Heart & renal disease, hypertensive, with heart failure ([place]) 02/19/2015  . Adult hypothyroidism 02/19/2015  . Neuritis or radiculitis due to rupture of lumbar intervertebral disc 02/19/2015  . Lumbar canal stenosis 02/19/2015  . Cancer of kidney ([place]) 02/19/2015  . Adiposity 02/19/2015  . Arthritis, degenerative 02/19/2015  . Impaired renal function 02/19/2015  . Sialoadenitis 02/19/2015  . Diabetic peripheral neuropathy associated with type 2 diabetes mellitus ([place]) 02/19/2015  . Phlebectasia 02/19/2015  . Angiitis ([place]) 02/19/2015  . Avitaminosis D 02/19/2015    No past medical history on file.  Social History   Social History  . Marital Status: Married    Spouse Name: N/A  . Number of Children: N/A  . Years of Education: N/A   Occupational History  . Not on file.   Social History Main Topics  . Smoking status: Never Smoker   . Smokeless  tobacco: Never Used  . Alcohol Use: No  . Drug Use: No  . Sexual Activity: Not on file   Other Topics Concern  . Not on file   Social History Narrative    Allergies  Allergen Reactions  . Augmentin  [Amoxicillin-Pot Clavulanate] Anaphylaxis  . Cleocin  [Clindamycin] Anaphylaxis  . Penicillins Anaphylaxis  . Cefprozil   . Codeine   . Citric Acid Sodium  [Sodium Citrate]   . Vancomycin Hcl   . Oxycodone Rash    Review of Systems  Constitutional: Positive for chills and malaise/fatigue.   HENT: Positive for sore throat.   Eyes: Negative.   Respiratory: Positive for cough. Negative for sputum production, shortness of breath and wheezing.   Cardiovascular: Negative.   Gastrointestinal: Negative.   Musculoskeletal: Positive for myalgias (better) and joint pain (better).  Skin: Negative.   Neurological: Positive for headaches.  Psychiatric/Behavioral: Negative.     Immunization History  Administered Date(s) Administered  . Influenza, High Dose Seasonal PF 07/04/2015  . Pneumococcal Conjugate-13 01/30/2014  . Pneumococcal Polysaccharide-23 07/27/2000, 07/23/2005  . Td 08/04/2007  . Zoster 10/31/2008   Objective:  BP 132/74 mmHg  Pulse 86  Temp(Src) 98.4 F (36.9 C)  Resp 14  Wt 219 lb (99.338 kg)  Physical Exam  Constitutional: She is oriented to person, place, and time and well-developed, well-nourished, and in no distress.  HENT:  Head: Normocephalic and atraumatic.  Right Ear: External ear normal.  Left Ear: External ear normal.  Mouth/Throat: Oropharynx is clear and moist.  Eyes: Conjunctivae are normal. Pupils are equal, round, and reactive to light.  Neck: Normal range of motion. Neck supple.  Cardiovascular: Normal rate, regular rhythm, normal heart sounds and intact distal pulses.   No murmur heard. Pulmonary/Chest: Effort normal and breath sounds normal. No respiratory distress. She has no wheezes.  Abdominal: Soft.  Musculoskeletal: She exhibits no edema or tenderness.  Neurological: She is alert and oriented to person, place, and time.  Skin: Skin is warm and dry.  Psychiatric: Mood, memory, affect and judgment normal.    Lab Results  Component Value Date   WBC 7.5 09/06/2015   HGB 13.2 05/29/2014   HCT 34.4 09/06/2015   PLT 314 09/06/2015   GLUCOSE 134* 09/06/2015   CHOL 145 09/06/2015   TRIG 76 09/06/2015   HDL 73 09/06/2015   LDLCALC 57 09/06/2015   TSH 2.530 09/06/2015   INR 1.0 09/22/2011   HGBA1C 6.5 09/05/2015    CMP       Component Value Date/Time   NA 139 09/06/2015 1002   NA 140 09/22/2011 1937   K 4.9 09/06/2015 1002   K 5.0 09/22/2011 1937   CL 102 09/06/2015 1002   CL 107 09/22/2011 1937   CO2 21 09/06/2015 1002   CO2 25 09/22/2011 1937   GLUCOSE 134* 09/06/2015 1002   GLUCOSE 121* 09/22/2011 1937   BUN 40* 09/06/2015 1002   BUN 34* 09/22/2011 1937   CREATININE 1.63* 09/06/2015 1002   CREATININE 1.9* 05/29/2014   CREATININE 1.85* 09/22/2011 1937   CALCIUM 9.6 09/06/2015 1002   CALCIUM 9.4 09/22/2011 1937   PROT 6.6 09/06/2015 1002   PROT 8.1 09/22/2011 1937   ALBUMIN 4.3 09/06/2015 1002   ALBUMIN 4.5 09/22/2011 1937   AST 12 09/06/2015 1002   AST 17 09/22/2011 1937   ALT 8 09/06/2015 1002   ALT 18 09/22/2011 1937   ALKPHOS 49 09/06/2015 1002   ALKPHOS 64 09/22/2011 1937   BILITOT  0.8 09/06/2015 1002   BILITOT 0.8 09/22/2011 1937   GFRNONAA 30* 09/06/2015 1002   GFRNONAA 28* 09/22/2011 1937   GFRAA 35* 09/06/2015 1002   GFRAA 34* 09/22/2011 1937    Assessment and Plan :  1. HLD (hyperlipidemia) Patient is off Lipitor and her myalgia improved. Will re check labs off the medication. - Lipid Panel With LDL/HDL Ratio - CK (Creatine Kinase) Discussed with patient that lipids are much better treated with statins but at this time we will declare her statin intolerant. 2. Cough Not keeping her awake at night. Try Robitussin, Rest and fluid. Follow as needed. Advised patient cough can linger for 2 weeks or so.  3. Hair loss New. Thyroid level was normal in December 2016. Offered dermatology referral and discussed OTC treatment she can try. Patient will wait and just follow for now.Pt  feels this probably stress-induced says she has been under a lot of stress over the last year. Matters and health issues especially.  4. Myalgia - CK (Creatine Kinase)  5. Bronchitis Advised patient most likely viral. Push fluids and rest. RX printed for Doxy in case she gets worse or starts running a  high fever. Follow as needed. - doxycycline (VIBRA-TABS) 100 MG tablet; Take 1 tablet (100 mg total) by mouth 2 (two) times daily.  Dispense: 14 tablet; Refill: 0 Workup if this seems to be worsening. She looks good today. 6. Hypothyroidism, unspecified hypothyroidism type 7. Hypertensive nephropathy 8. Obesity  Patient was seen and examined by Dr. Eulas Post and note was scribed by Theressa Millard, RMA.    Miguel Aschoff MD [facility] 11/06/2015 8:39 AM

## 2015-11-07 ENCOUNTER — Other Ambulatory Visit: Payer: Self-pay | Admitting: Emergency Medicine

## 2015-11-07 DIAGNOSIS — E785 Hyperlipidemia, unspecified: Secondary | ICD-10-CM

## 2015-11-07 LAB — LIPID PANEL WITH LDL/HDL RATIO
Cholesterol, Total: 206 mg/dL — ABNORMAL HIGH (ref 100–199)
HDL: 67 mg/dL (ref >39)
LDL CALC: 120 mg/dL — AB (ref 0–99)
LDL/HDL RATIO: 1.8 ratio (ref 0.0–3.2)
TRIGLYCERIDES: 96 mg/dL (ref 0–149)
VLDL CHOLESTEROL CAL: 19 mg/dL (ref 5–40)

## 2015-11-07 LAB — CK: Total CK: 47 U/L (ref 24–173)

## 2015-11-07 MED ORDER — EZETIMIBE 10 MG PO TABS: 10.0000 mg | ORAL_TABLET | Freq: Every day | ORAL | Status: DC

## 2015-11-08 ENCOUNTER — Telehealth: Payer: Self-pay | Admitting: Family Medicine

## 2015-11-08 NOTE — Telephone Encounter (Signed)
Pt states she was in on Tuesday and rec'd a Rx for doxycycline (VIBRA-TABS) 100 MG tablet.  Pt states she has been taking this but is worse.  Pt has a fever, headache, sore throat and cough.  Pt is asking if she will need something different.  [facility].  [id number]

## 2015-11-08 NOTE — Telephone Encounter (Signed)
Can try Z-Pak. She has multiple drug allergies. She may need to be seen tomorrow if she is feeling worse.

## 2015-11-08 NOTE — Telephone Encounter (Signed)
New Rx

## 2015-11-09 ENCOUNTER — Other Ambulatory Visit: Payer: Self-pay

## 2015-11-09 MED ORDER — AZITHROMYCIN 250 MG PO TABS: ORAL_TABLET | ORAL | Status: DC

## 2015-12-10 ENCOUNTER — Encounter: Payer: Self-pay | Admitting: Family Medicine

## 2015-12-10 ENCOUNTER — Ambulatory Visit (INDEPENDENT_AMBULATORY_CARE_PROVIDER_SITE_OTHER): Payer: Medicare HMO | Admitting: Family Medicine

## 2015-12-10 VITALS — BP 124/72 | HR 64 | Temp 98.6°F | Resp 16 | Wt 221.0 lb

## 2015-12-10 DIAGNOSIS — E1121 Type 2 diabetes mellitus with diabetic nephropathy: Secondary | ICD-10-CM | POA: Diagnosis not present

## 2015-12-10 DIAGNOSIS — M791 Myalgia, unspecified site: Secondary | ICD-10-CM

## 2015-12-10 DIAGNOSIS — E785 Hyperlipidemia, unspecified: Secondary | ICD-10-CM | POA: Diagnosis not present

## 2015-12-10 MED ORDER — EZETIMIBE 10 MG PO TABS: 10.0000 mg | ORAL_TABLET | Freq: Every day | ORAL | Status: DC

## 2015-12-10 NOTE — Progress Notes (Signed)
Patient ID: Gina Michael, female   DOB: 1936/01/07, 80 y.o.   MRN: [id number]    Subjective:  HPI  Hyperlipidemia follow up: In February re checked lipid levels off statin and levels were worse. Patient started Zetia after that and so far has tolerated that ok as far as she can tell. Patient filled it as brand name but now the pharmacist says they have generic and she would like to switch to that if she is to continue taking this medication.  Lab Results  Component Value Date   CHOL 206* 11/06/2015   HDL 67 11/06/2015   LDLCALC 120* 11/06/2015   TRIG 96 11/06/2015     Prior to Admission medications   Medication Sig Start Date End Date Taking? Authorizing Provider  aspirin 81 MG EC tablet Take by mouth. 03/06/11  Yes Historical Provider, MD  atorvastatin (LIPITOR) 10 MG tablet Take by mouth. Reported on 11/06/2015 12/27/14  Yes Historical Provider, MD  BIOTIN PO Take by mouth daily.   Yes Historical Provider, MD  Cholecalciferol 1000 UNITS TBDP Take by mouth.   Yes Historical Provider, MD  Docusate Sodium 100 MG capsule Take by mouth.   Yes Historical Provider, MD  ezetimibe (ZETIA) 10 MG tablet Take 1 tablet (10 mg total) by mouth daily. 11/07/15  Yes Richard Maceo Pro., MD  glimepiride (AMARYL) 4 MG tablet Take by mouth. 02/07/15  Yes Historical Provider, MD  levothyroxine (SYNTHROID, LEVOTHROID) 88 MCG tablet TAKE ONE TABLET EVERY DAY 04/30/15  Yes Jerrol Banana., MD  losartan (COZAAR) 50 MG tablet TAKE ONE TABLET BY MOUTH EVERY DAY 09/12/15  Yes Jerrol Banana., MD  Omeprazole 20 MG TBEC Take by mouth. 06/11/11  Yes Historical Provider, MD  pioglitazone (ACTOS) 15 MG tablet TAKE ONE TABLET BY MOUTH EVERY MORNING 07/09/15  Yes Jerrol Banana., MD  ranitidine (ZANTAC) 300 MG capsule Take by mouth. 09/12/13  Yes Historical Provider, MD  sodium bicarbonate 650 MG tablet Take by mouth.   Yes Historical Provider, MD  triamcinolone cream (KENALOG) 0.1 % Apply 1 application  topically 2 (two) times daily. 06/14/15  Yes Richard Maceo Pro., MD    Patient Active Problem List   Diagnosis Date Noted  . Diabetes ([place]) 05/09/2015  . Bell palsy 02/19/2015  . Chronic venous insufficiency 02/19/2015  . DDD (degenerative disc disease), lumbar 02/19/2015  . Diabetic neuropathy ([place]) 02/19/2015  . Urinary system disease 02/19/2015  . Edema extremities 02/19/2015  . Bloodgood disease 02/19/2015  . Acid reflux 02/19/2015  . Cephalalgia 02/19/2015  . H/O adenomatous polyp of colon 02/19/2015  . HLD (hyperlipidemia) 02/19/2015  . Heart & renal disease, hypertensive, with heart failure ([place]) 02/19/2015  . Adult hypothyroidism 02/19/2015  . Neuritis or radiculitis due to rupture of lumbar intervertebral disc 02/19/2015  . Lumbar canal stenosis 02/19/2015  . Cancer of kidney ([place]) 02/19/2015  . Adiposity 02/19/2015  . Arthritis, degenerative 02/19/2015  . Impaired renal function 02/19/2015  . Sialoadenitis 02/19/2015  . Diabetic peripheral neuropathy associated with type 2 diabetes mellitus ([place]) 02/19/2015  . Phlebectasia 02/19/2015  . Angiitis ([place]) 02/19/2015  . Avitaminosis D 02/19/2015    No past medical history on file.  Social History   Social History  . Marital Status: Married    Spouse Name: N/A  . Number of Children: N/A  . Years of Education: N/A   Occupational History  . Not on file.   Social History Main Topics  . Smoking  status: Never Smoker   . Smokeless tobacco: Never Used  . Alcohol Use: No  . Drug Use: No  . Sexual Activity: Not on file   Other Topics Concern  . Not on file   Social History Narrative    Allergies  Allergen Reactions  . Augmentin  [Amoxicillin-Pot Clavulanate] Anaphylaxis  . Cleocin  [Clindamycin] Anaphylaxis  . Penicillins Anaphylaxis  . Cefprozil   . Codeine   . Citric Acid Sodium  [Sodium Citrate]   . Vancomycin Hcl   . Oxycodone Rash    Review of Systems  Constitutional: Negative.   Eyes:  Negative.   Respiratory: Negative.   Cardiovascular: Negative.   Gastrointestinal: Negative.   Musculoskeletal: Positive for joint pain (arthirtis related).  Skin: Negative.   Endo/Heme/Allergies: Negative.   Psychiatric/Behavioral: Negative.     Immunization History  Administered Date(s) Administered  . Influenza, High Dose Seasonal PF 07/04/2015  . Pneumococcal Conjugate-13 01/30/2014  . Pneumococcal Polysaccharide-23 07/27/2000, 07/23/2005  . Td 08/04/2007  . Zoster 10/31/2008   Objective:  BP 124/72 mmHg  Pulse 64  Temp(Src) 98.6 F (37 C)  Resp 16  Wt 221 lb (100.245 kg)  Physical Exam  Constitutional: She is oriented to person, place, and time and well-developed, well-nourished, and in no distress.  HENT:  Head: Normocephalic and atraumatic.  Eyes: Conjunctivae are normal. Pupils are equal, round, and reactive to light.  Neck: Normal range of motion. Neck supple.  Cardiovascular: Normal rate, regular rhythm, normal heart sounds and intact distal pulses.   No murmur heard. Pulmonary/Chest: Effort normal and breath sounds normal. No respiratory distress. She has no wheezes.  Neurological: She is alert and oriented to person, place, and time.    Lab Results  Component Value Date   WBC 7.5 09/06/2015   HGB 13.2 05/29/2014   HCT 34.4 09/06/2015   PLT 314 09/06/2015   GLUCOSE 134* 09/06/2015   CHOL 206* 11/06/2015   TRIG 96 11/06/2015   HDL 67 11/06/2015   LDLCALC 120* 11/06/2015   TSH 2.530 09/06/2015   INR 1.0 09/22/2011   HGBA1C 6.5 09/05/2015    CMP     Component Value Date/Time   NA 139 09/06/2015 1002   NA 140 09/22/2011 1937   K 4.9 09/06/2015 1002   K 5.0 09/22/2011 1937   CL 102 09/06/2015 1002   CL 107 09/22/2011 1937   CO2 21 09/06/2015 1002   CO2 25 09/22/2011 1937   GLUCOSE 134* 09/06/2015 1002   GLUCOSE 121* 09/22/2011 1937   BUN 40* 09/06/2015 1002   BUN 34* 09/22/2011 1937   CREATININE 1.63* 09/06/2015 1002   CREATININE 1.9*  05/29/2014   CREATININE 1.85* 09/22/2011 1937   CALCIUM 9.6 09/06/2015 1002   CALCIUM 9.4 09/22/2011 1937   PROT 6.6 09/06/2015 1002   PROT 8.1 09/22/2011 1937   ALBUMIN 4.3 09/06/2015 1002   ALBUMIN 4.5 09/22/2011 1937   AST 12 09/06/2015 1002   AST 17 09/22/2011 1937   ALT 8 09/06/2015 1002   ALT 18 09/22/2011 1937   ALKPHOS 49 09/06/2015 1002   ALKPHOS 64 09/22/2011 1937   BILITOT 0.8 09/06/2015 1002   BILITOT 0.8 09/22/2011 1937   GFRNONAA 30* 09/06/2015 1002   GFRNONAA 28* 09/22/2011 1937   GFRAA 35* 09/06/2015 1002   GFRAA 34* 09/22/2011 1937    Assessment and Plan :  1. HLD (hyperlipidemia) Will re check levels on Zetia today. She feels better off statins. Tolerating Zetia so far, will refill as  generic. - Lipid Panel With LDL/HDL Ratio  2. Myalgia Improved off statins.  3. Type 2 diabetes mellitus with diabetic nephropathy, without long-term current use of insulin ([place]) Will order through labs today. - HgB A1c I have done the exam and reviewed the above chart and it is accurate to the best of my knowledge.  Patient was seen and examined by Dr. Eulas Post and note was scribed by Theressa Millard, RMA.  Miguel Aschoff MD [facility] 12/10/2015 11:40 AM

## 2015-12-11 ENCOUNTER — Other Ambulatory Visit: Payer: Self-pay | Admitting: Family Medicine

## 2015-12-11 DIAGNOSIS — E1121 Type 2 diabetes mellitus with diabetic nephropathy: Secondary | ICD-10-CM | POA: Diagnosis not present

## 2015-12-11 DIAGNOSIS — E785 Hyperlipidemia, unspecified: Secondary | ICD-10-CM | POA: Diagnosis not present

## 2015-12-12 ENCOUNTER — Telehealth: Payer: Self-pay

## 2015-12-12 LAB — LIPID PANEL WITH LDL/HDL RATIO
CHOLESTEROL TOTAL: 167 mg/dL (ref 100–199)
HDL: 60 mg/dL (ref >39)
LDL CALC: 87 mg/dL (ref 0–99)
LDl/HDL Ratio: 1.5 ratio units (ref 0.0–3.2)
Triglycerides: 99 mg/dL (ref 0–149)
VLDL Cholesterol Cal: 20 mg/dL (ref 5–40)

## 2015-12-12 LAB — HEMOGLOBIN A1C
ESTIMATED AVERAGE GLUCOSE: 140 mg/dL
Hgb A1c MFr Bld: 6.5 % — ABNORMAL HIGH (ref 4.8–5.6)

## 2015-12-12 NOTE — Telephone Encounter (Signed)
-----   Message from Jerrol Banana., MD sent at 12/12/2015 11:38 AM EDT ----- Cholesterol better.

## 2015-12-12 NOTE — Telephone Encounter (Signed)
Advised pt of lab results. Pt verbally acknowledges understanding. Amed Datta Drozdowski, CMA   

## 2015-12-17 DIAGNOSIS — N2581 Secondary hyperparathyroidism of renal origin: Secondary | ICD-10-CM | POA: Diagnosis not present

## 2015-12-17 DIAGNOSIS — N183 Chronic kidney disease, stage 3 (moderate): Secondary | ICD-10-CM | POA: Diagnosis not present

## 2015-12-17 DIAGNOSIS — E872 Acidosis: Secondary | ICD-10-CM | POA: Diagnosis not present

## 2015-12-17 DIAGNOSIS — R809 Proteinuria, unspecified: Secondary | ICD-10-CM | POA: Diagnosis not present

## 2015-12-18 DIAGNOSIS — E113393 Type 2 diabetes mellitus with moderate nonproliferative diabetic retinopathy without macular edema, bilateral: Secondary | ICD-10-CM | POA: Diagnosis not present

## 2016-01-03 ENCOUNTER — Other Ambulatory Visit: Payer: Self-pay | Admitting: Family Medicine

## 2016-01-28 DIAGNOSIS — Z1231 Encounter for screening mammogram for malignant neoplasm of breast: Secondary | ICD-10-CM | POA: Diagnosis not present

## 2016-01-28 DIAGNOSIS — C649 Malignant neoplasm of unspecified kidney, except renal pelvis: Secondary | ICD-10-CM | POA: Diagnosis not present

## 2016-02-02 ENCOUNTER — Other Ambulatory Visit: Payer: Self-pay | Admitting: Family Medicine

## 2016-03-11 ENCOUNTER — Other Ambulatory Visit: Payer: Self-pay | Admitting: Family Medicine

## 2016-03-12 DIAGNOSIS — Z85528 Personal history of other malignant neoplasm of kidney: Secondary | ICD-10-CM | POA: Diagnosis not present

## 2016-03-12 DIAGNOSIS — C649 Malignant neoplasm of unspecified kidney, except renal pelvis: Secondary | ICD-10-CM | POA: Diagnosis not present

## 2016-04-01 ENCOUNTER — Other Ambulatory Visit: Payer: Self-pay | Admitting: Family Medicine

## 2016-04-02 DIAGNOSIS — N183 Chronic kidney disease, stage 3 (moderate): Secondary | ICD-10-CM | POA: Diagnosis not present

## 2016-04-02 DIAGNOSIS — N2581 Secondary hyperparathyroidism of renal origin: Secondary | ICD-10-CM | POA: Diagnosis not present

## 2016-04-02 DIAGNOSIS — E872 Acidosis: Secondary | ICD-10-CM | POA: Diagnosis not present

## 2016-04-02 DIAGNOSIS — N281 Cyst of kidney, acquired: Secondary | ICD-10-CM | POA: Diagnosis not present

## 2016-04-10 DIAGNOSIS — C642 Malignant neoplasm of left kidney, except renal pelvis: Secondary | ICD-10-CM | POA: Diagnosis not present

## 2016-04-10 DIAGNOSIS — N184 Chronic kidney disease, stage 4 (severe): Secondary | ICD-10-CM | POA: Diagnosis not present

## 2016-04-10 DIAGNOSIS — M2550 Pain in unspecified joint: Secondary | ICD-10-CM | POA: Diagnosis not present

## 2016-04-10 DIAGNOSIS — M791 Myalgia: Secondary | ICD-10-CM | POA: Diagnosis not present

## 2016-04-24 DIAGNOSIS — N184 Chronic kidney disease, stage 4 (severe): Secondary | ICD-10-CM | POA: Diagnosis not present

## 2016-04-24 DIAGNOSIS — M353 Polymyalgia rheumatica: Secondary | ICD-10-CM | POA: Insufficient documentation

## 2016-04-24 DIAGNOSIS — I10 Essential (primary) hypertension: Secondary | ICD-10-CM | POA: Diagnosis not present

## 2016-05-06 ENCOUNTER — Ambulatory Visit (INDEPENDENT_AMBULATORY_CARE_PROVIDER_SITE_OTHER): Payer: Medicare HMO | Admitting: Family Medicine

## 2016-05-06 ENCOUNTER — Encounter: Payer: Self-pay | Admitting: Family Medicine

## 2016-05-06 VITALS — BP 130/72 | HR 72 | Temp 97.7°F | Resp 14 | Wt 225.0 lb

## 2016-05-06 DIAGNOSIS — M353 Polymyalgia rheumatica: Secondary | ICD-10-CM

## 2016-05-06 DIAGNOSIS — E785 Hyperlipidemia, unspecified: Secondary | ICD-10-CM

## 2016-05-06 DIAGNOSIS — E669 Obesity, unspecified: Secondary | ICD-10-CM

## 2016-05-06 DIAGNOSIS — E1121 Type 2 diabetes mellitus with diabetic nephropathy: Secondary | ICD-10-CM | POA: Diagnosis not present

## 2016-05-06 DIAGNOSIS — I872 Venous insufficiency (chronic) (peripheral): Secondary | ICD-10-CM | POA: Diagnosis not present

## 2016-05-06 LAB — POCT UA - MICROALBUMIN: MICROALBUMIN (UR) POC: 100 mg/L

## 2016-05-06 LAB — POCT GLYCOSYLATED HEMOGLOBIN (HGB A1C): Hemoglobin A1C: 7.3

## 2016-05-06 NOTE — Progress Notes (Signed)
Subjective:  HPI  Patient is here for 6 months follow up.  Hypertension: patient does not check her b/p at home. Dr Holley Raring increased her Losartan to 100 mg. She is not sure if it was because of b/p or kidney levels. BP Readings from Last 3 Encounters:  05/06/16 130/72  12/10/15 124/72  11/06/15 132/74   Diabetes: patient checks her sugar for fasting has been about 118 but since been on Prednisone 5 mg 3 tablets daily since June 30th fasting readings have been 126-127. Prednisone was started by Dr Theadore Nan for polymyalgiaNo hypoglycemic episodes. Polymyalgia rheumatica is improved symptomatically since starting prednisone. She is followed by Dr. Meda Coffee  for this Lab Results  Component Value Date   HGBA1C 6.5 (H) 12/11/2015    Prior to Admission medications   Medication Sig Start Date End Date Taking? Authorizing Provider  aspirin 81 MG EC tablet Take by mouth. 03/06/11  Yes Historical Provider, MD  BIOTIN PO Take by mouth daily.   Yes Historical Provider, MD  Cholecalciferol 1000 UNITS TBDP Take by mouth.   Yes Historical Provider, MD  Docusate Sodium 100 MG capsule Take by mouth.   Yes Historical Provider, MD  ezetimibe (ZETIA) 10 MG tablet Take 1 tablet (10 mg total) by mouth daily. 12/10/15  Yes Richard Maceo Pro., MD  glimepiride (AMARYL) 4 MG tablet TAKE TWO TABLETS BY MOUTH EVERY DAY 04/01/16  Yes Richard Maceo Pro., MD  levothyroxine (SYNTHROID, LEVOTHROID) 88 MCG tablet TAKE ONE TABLET EVERY DAY 04/30/15  Yes Jerrol Banana., MD  losartan (COZAAR) 100 MG tablet Take 1 tablet by mouth daily. 04/02/16  Yes Historical Provider, MD  Omeprazole 20 MG TBEC Take by mouth. 06/11/11  Yes Historical Provider, MD  pioglitazone (ACTOS) 15 MG tablet TAKE ONE TABLET EVERY MORNING 01/03/16  Yes Richard Maceo Pro., MD  predniSONE (DELTASONE) 5 MG tablet Take 3 tablets by mouth daily. 04/14/16  Yes Historical Provider, MD  ranitidine (ZANTAC) 300 MG capsule Take by mouth. 09/12/13  Yes  Historical Provider, MD  sodium bicarbonate 650 MG tablet Take by mouth.   Yes Historical Provider, MD  triamcinolone cream (KENALOG) 0.1 % Apply 1 application topically 2 (two) times daily. 06/14/15  Yes Richard Maceo Pro., MD    Patient Active Problem List   Diagnosis Date Noted  . Diabetes ([place]) 05/09/2015  . Bell palsy 02/19/2015  . Chronic venous insufficiency 02/19/2015  . DDD (degenerative disc disease), lumbar 02/19/2015  . Diabetic neuropathy ([place]) 02/19/2015  . Urinary system disease 02/19/2015  . Edema extremities 02/19/2015  . Bloodgood disease 02/19/2015  . Acid reflux 02/19/2015  . Cephalalgia 02/19/2015  . H/O adenomatous polyp of colon 02/19/2015  . HLD (hyperlipidemia) 02/19/2015  . Heart & renal disease, hypertensive, with heart failure ([place]) 02/19/2015  . Adult hypothyroidism 02/19/2015  . Neuritis or radiculitis due to rupture of lumbar intervertebral disc 02/19/2015  . Lumbar canal stenosis 02/19/2015  . Cancer of kidney ([place]) 02/19/2015  . Adiposity 02/19/2015  . Arthritis, degenerative 02/19/2015  . Impaired renal function 02/19/2015  . Sialoadenitis 02/19/2015  . Diabetic peripheral neuropathy associated with type 2 diabetes mellitus ([place]) 02/19/2015  . Phlebectasia 02/19/2015  . Angiitis ([place]) 02/19/2015  . Avitaminosis D 02/19/2015    No past medical history on file.  Social History   Social History  . Marital status: Married    Spouse name: N/A  . Number of children: N/A  . Years of education: N/A  Occupational History  . Not on file.   Social History Main Topics  . Smoking status: Never Smoker  . Smokeless tobacco: Never Used  . Alcohol use No  . Drug use: No  . Sexual activity: Not on file   Other Topics Concern  . Not on file   Social History Narrative  . No narrative on file    Allergies  Allergen Reactions  . Augmentin  [Amoxicillin-Pot Clavulanate] Anaphylaxis  . Cleocin  [Clindamycin] Anaphylaxis  . Penicillins  Anaphylaxis  . Cefprozil   . Citric Acid Sodium  [Sodium Citrate]   . Codeine   . Vancomycin Hcl   . Oxycodone Rash    Review of Systems  Constitutional: Negative.   Eyes: Negative.   Respiratory: Negative.   Cardiovascular: Negative.   Gastrointestinal: Negative.   Musculoskeletal: Positive for joint pain and myalgias.  Skin: Negative.   Neurological: Negative.   Endo/Heme/Allergies: Negative.   Psychiatric/Behavioral: Negative.     Immunization History  Administered Date(s) Administered  . Influenza, High Dose Seasonal PF 07/04/2015  . Pneumococcal Conjugate-13 01/30/2014  . Pneumococcal Polysaccharide-23 07/27/2000, 07/23/2005  . Td 08/04/2007  . Zoster 10/31/2008   Objective:  BP 130/72   Pulse 72   Temp 97.7 F (36.5 C)   Resp 14   Wt 225 lb (102.1 kg)   BMI 41.15 kg/m   Physical Exam  Constitutional: She is oriented to person, place, and time and well-developed, well-nourished, and in no distress.  HENT:  Head: Normocephalic and atraumatic.  Right Ear: External ear normal.  Left Ear: External ear normal.  Nose: Nose normal.  Eyes: Conjunctivae are normal. Pupils are equal, round, and reactive to light.  Neck: Normal range of motion. Neck supple.  Cardiovascular: Normal rate, regular rhythm, normal heart sounds and intact distal pulses.   No murmur heard. Pulmonary/Chest: Effort normal and breath sounds normal. No respiratory distress. She has no wheezes.  Abdominal: Soft.  Neurological: She is alert and oriented to person, place, and time. No cranial nerve deficit. She exhibits normal muscle tone. Gait normal.  Skin: Skin is warm and dry.  Psychiatric: Mood, memory, affect and judgment normal.    Lab Results  Component Value Date   WBC 7.5 09/06/2015   HGB 13.2 05/29/2014   HCT 34.4 09/06/2015   PLT 314 09/06/2015   GLUCOSE 134 (H) 09/06/2015   CHOL 167 12/11/2015   TRIG 99 12/11/2015   HDL 60 12/11/2015   LDLCALC 87 12/11/2015   TSH 2.530  09/06/2015   INR 1.0 09/22/2011   HGBA1C 6.5 (H) 12/11/2015    CMP     Component Value Date/Time   NA 139 09/06/2015 1002   NA 140 09/22/2011 1937   K 4.9 09/06/2015 1002   K 5.0 09/22/2011 1937   CL 102 09/06/2015 1002   CL 107 09/22/2011 1937   CO2 21 09/06/2015 1002   CO2 25 09/22/2011 1937   GLUCOSE 134 (H) 09/06/2015 1002   GLUCOSE 121 (H) 09/22/2011 1937   BUN 40 (H) 09/06/2015 1002   BUN 34 (H) 09/22/2011 1937   CREATININE 1.63 (H) 09/06/2015 1002   CREATININE 1.85 (H) 09/22/2011 1937   CALCIUM 9.6 09/06/2015 1002   CALCIUM 9.4 09/22/2011 1937   PROT 6.6 09/06/2015 1002   PROT 8.1 09/22/2011 1937   ALBUMIN 4.3 09/06/2015 1002   ALBUMIN 4.5 09/22/2011 1937   AST 12 09/06/2015 1002   AST 17 09/22/2011 1937   ALT 8 09/06/2015 1002  ALT 18 09/22/2011 1937   ALKPHOS 49 09/06/2015 1002   ALKPHOS 64 09/22/2011 1937   BILITOT 0.8 09/06/2015 1002   BILITOT 0.8 09/22/2011 1937   GFRNONAA 30 (L) 09/06/2015 1002   GFRNONAA 28 (L) 09/22/2011 1937   GFRAA 35 (L) 09/06/2015 1002   GFRAA 34 (L) 09/22/2011 1937    Assessment and Plan :  1. Type 2 diabetes mellitus with diabetic nephropathy, without long-term current use of insulin ([facility]) A1C 7.3 worse but it is due to Prednisone use at this time. Follow. Make no changes to her medications at this time.  2. HLD (hyperlipidemia) Stable.  3. Polymyalgia rheumatica syndrome ([place]) Following rheumatologist. Stable.  4. Adiposity Need to work on diet and exercise.  5. Chronic venous insufficiency 6. Diabetic nephropathy with CKD Followed by nephrology Patient is on losartan. Patient was seen and examined by Dr. Eulas Post and note was scribed by Theressa Millard, RMA.    Miguel Aschoff MD [facility] 05/06/2016 8:37 AM

## 2016-05-22 ENCOUNTER — Other Ambulatory Visit: Payer: Self-pay | Admitting: Family Medicine

## 2016-07-09 ENCOUNTER — Ambulatory Visit (INDEPENDENT_AMBULATORY_CARE_PROVIDER_SITE_OTHER): Payer: Medicare HMO | Admitting: Family Medicine

## 2016-07-09 ENCOUNTER — Encounter: Payer: Self-pay | Admitting: Family Medicine

## 2016-07-09 VITALS — BP 136/72 | HR 80 | Temp 98.2°F | Resp 16 | Ht 62.0 in | Wt 231.0 lb

## 2016-07-09 DIAGNOSIS — E7800 Pure hypercholesterolemia, unspecified: Secondary | ICD-10-CM

## 2016-07-09 DIAGNOSIS — Z23 Encounter for immunization: Secondary | ICD-10-CM | POA: Diagnosis not present

## 2016-07-09 DIAGNOSIS — E039 Hypothyroidism, unspecified: Secondary | ICD-10-CM

## 2016-07-09 DIAGNOSIS — E1142 Type 2 diabetes mellitus with diabetic polyneuropathy: Secondary | ICD-10-CM

## 2016-07-09 DIAGNOSIS — Z0000 Encounter for general adult medical examination without abnormal findings: Secondary | ICD-10-CM

## 2016-07-09 DIAGNOSIS — C649 Malignant neoplasm of unspecified kidney, except renal pelvis: Secondary | ICD-10-CM

## 2016-07-09 DIAGNOSIS — I130 Hypertensive heart and chronic kidney disease with heart failure and stage 1 through stage 4 chronic kidney disease, or unspecified chronic kidney disease: Secondary | ICD-10-CM

## 2016-07-09 DIAGNOSIS — M353 Polymyalgia rheumatica: Secondary | ICD-10-CM | POA: Diagnosis not present

## 2016-07-09 NOTE — Progress Notes (Signed)
Patient: Gina Michael, Female    DOB: 1935-12-09, 80 y.o.   MRN: [id number] Visit Date: 07/09/2016  Today's Provider: Wilhemena Durie, MD   No chief complaint on file.  Subjective:    Annual wellness visit Gina Michael is a 80 y.o. female. She feels fairly well. She reports exercising daily (usually); walks about 30 minutes per day. She reports she is sleeping fairly well.She has 2 children and 3 grandchildren. Her husband is in failing health. She is followed by [facility]  for renal cell carcinoma. She gets a yearly MRI. She is followed by Dr. Holley Raring for diabetic nephropathy her diabetes has been well controlled recently. She is taking her medications and has no complaints. She sees Dr. Linzie Collin from rheumatology for arthralgias and is better on prednisone and recent months.  Last mammogram- 01/16/2014- negative Last colonoscopy- 06/20/2013- 4 small polyps (tubular adenoma), 2 diminutive polyps, diverticulosis. Internal and external hemorrhoids. Last BMD- 06/30/2012- osteopenia  -----------------------------------------------------------  Diabetes Mellitus Type II, Follow-up:   Lab Results  Component Value Date   HGBA1C 7.3 05/06/2016   HGBA1C 6.5 (H) 12/11/2015   HGBA1C 6.5 09/05/2015    Last seen for diabetes 2 months ago.  Management since then includes making no changes. A1C was worse but that was due to Prednisone. Pt still on Prednisone. Currently taking 10 mg daily. Has been on prednisone x 3 months for polymyalgia (prescribed by rheumatology). She reports excellent compliance with treatment. She is not having side effects.  Current symptoms include none and have been stable. Home blood sugar records: not being checked  Episodes of hypoglycemia? no     Pertinent Labs:    Component Value Date/Time   CHOL 167 12/11/2015 0835   TRIG 99 12/11/2015 0835   HDL 60 12/11/2015 0835   LDLCALC 87 12/11/2015 0835   CREATININE 1.63 (H) 09/06/2015 1002   CREATININE  1.85 (H) 09/22/2011 1937    Wt Readings from Last 3 Encounters:  05/06/16 225 lb (102.1 kg)  12/10/15 221 lb (100.2 kg)  11/06/15 219 lb (99.3 kg)    ------------------------------------------------------------------------    Review of Systems  Constitutional: Positive for unexpected weight change (due to prednisone ). Negative for activity change, appetite change, chills, diaphoresis, fatigue and fever.  HENT: Negative.   Eyes: Negative.   Respiratory: Negative.   Cardiovascular: Positive for leg swelling (due to sitting in chair for long time while at husband's hospital bed side). Negative for chest pain and palpitations.  Gastrointestinal: Negative.   Endocrine: Negative.   Genitourinary: Negative.   Musculoskeletal: Positive for myalgias (polymyalgia). Negative for arthralgias, back pain, gait problem, joint swelling, neck pain and neck stiffness.  Skin: Negative.   Allergic/Immunologic: Negative.   Neurological: Negative.   Hematological: Negative.   Psychiatric/Behavioral: Negative.     Social History   Social History  . Marital status: Married    Spouse name: N/A  . Number of children: N/A  . Years of education: N/A   Occupational History  . Not on file.   Social History Main Topics  . Smoking status: Never Smoker  . Smokeless tobacco: Never Used  . Alcohol use No  . Drug use: No  . Sexual activity: Not on file   Other Topics Concern  . Not on file   Social History Narrative  . No narrative on file    No past medical history on file.   Patient Active Problem List   Diagnosis Date Noted  .  Polymyalgia rheumatica syndrome ([place]) 04/24/2016  . Diabetes ([place]) 05/09/2015  . Bell palsy 02/19/2015  . Chronic venous insufficiency 02/19/2015  . DDD (degenerative disc disease), lumbar 02/19/2015  . Diabetic neuropathy ([place]) 02/19/2015  . Urinary system disease 02/19/2015  . Edema extremities 02/19/2015  . Bloodgood disease 02/19/2015  . Acid reflux  02/19/2015  . Cephalalgia 02/19/2015  . H/O adenomatous polyp of colon 02/19/2015  . HLD (hyperlipidemia) 02/19/2015  . Heart & renal disease, hypertensive, with heart failure ([place]) 02/19/2015  . Adult hypothyroidism 02/19/2015  . Neuritis or radiculitis due to rupture of lumbar intervertebral disc 02/19/2015  . Lumbar canal stenosis 02/19/2015  . Cancer of kidney ([place]) 02/19/2015  . Adiposity 02/19/2015  . Arthritis, degenerative 02/19/2015  . Impaired renal function 02/19/2015  . Sialoadenitis 02/19/2015  . Diabetic peripheral neuropathy associated with type 2 diabetes mellitus ([place]) 02/19/2015  . Phlebectasia 02/19/2015  . Angiitis ([place]) 02/19/2015  . Avitaminosis D 02/19/2015    Past Surgical History:  Procedure Laterality Date  . ABDOMINAL HYSTERECTOMY  1984  . APPENDECTOMY    . BLADDER SURGERY  1985  . BREAST SURGERY     Biopsy  . CARPAL TUNNEL RELEASE  1987  . CATARACT EXTRACTION, BILATERAL     rt 09/2010; lt 01/2011  . CHOLECYSTECTOMY    . EYE SURGERY  10/2010   Repair macular hole  . FRACTURE SURGERY Left 1969  . HERNIA REPAIR  06/06/2008   Had a surgery later to repair a infected mesh ( 10/30/09)  . JOINT REPLACEMENT Right 2004  . NEPHRECTOMY Left   . OOPHORECTOMY Left    benign    Her family history includes Cancer in her brother and father; Kidney disease in her mother.    No outpatient prescriptions have been marked as taking for the 07/09/16 encounter (Appointment) with Jerrol Banana., MD.    Patient Care Team: Jerrol Banana., MD as PCP - General (Family Medicine)    Objective:   Vitals: There were no vitals taken for this visit.  Physical Exam  Constitutional: She is oriented to person, place, and time. She appears well-developed and well-nourished.  HENT:  Head: Normocephalic and atraumatic.  Right Ear: External ear normal.  Left Ear: External ear normal.  Nose: Nose normal.  Mouth/Throat: Oropharynx is clear and moist. No  oropharyngeal exudate.  Eyes: Conjunctivae and EOM are normal. Pupils are equal, round, and reactive to light. Right eye exhibits no discharge. Left eye exhibits no discharge.  Neck: Normal range of motion. Neck supple. No tracheal deviation present. No thyromegaly present.  Cardiovascular: Normal rate, regular rhythm and normal heart sounds.   Pulmonary/Chest: Effort normal and breath sounds normal. No respiratory distress. She has no wheezes. She has no rales. She exhibits no tenderness.  Abdominal: Soft. There is no tenderness.  Genitourinary:  Genitourinary Comments: Pt declines breast/DRE exam  Musculoskeletal: Normal range of motion. She exhibits edema (LLE 2 cm wider than right). She exhibits no tenderness or deformity.  1+ lower extremity edema  Lymphadenopathy:    She has no cervical adenopathy.  Neurological: She is alert and oriented to person, place, and time. She has normal reflexes.  Skin: Skin is warm and dry.  Several toenails removed by Dr. Milinda Pointer.  Psychiatric: She has a normal mood and affect. Her behavior is normal. Judgment and thought content normal.  Patient declines breast exam and digital rectal exam.  Activities of Daily Living In your present state of health, do you have  any difficulty performing the following activities: 09/05/2015  Hearing? N  Vision? N  Difficulty concentrating or making decisions? N  Walking or climbing stairs? N  Dressing or bathing? N  Doing errands, shopping? N  Some recent data might be hidden    Fall Risk Assessment Fall Risk  09/05/2015  Falls in the past year? No     Depression Screen PHQ 2/9 Scores 09/05/2015  PHQ - 2 Score 0    Cognitive Testing - 6-CIT  Correct? Score   What year is it? yes 0 0 or 4  What month is it? yes 0 0 or 3  Memorize:    Pia Mau,  44,  [address],  [place],      What time is it? (within 1 hour) yes 0 0 or 3  Count backwards from 20 yes 0 0, 2, or 4  Name the months of the year yes 0 0,  2, or 4  Repeat name & address above yes 0 0, 2, 4, 6, 8, or 10       TOTAL SCORE  0/28   Interpretation:  Normal  Normal (0-7) Abnormal (8-28)       Assessment & Plan:     Annual Wellness Visit  Reviewed patient's Family Medical History Reviewed and updated list of patient's medical providers Assessment of cognitive impairment was done Assessed patient's functional ability Established a written schedule for health screening [place] Completed and Reviewed  Exercise Activities and Dietary recommendations Goals    None      Immunization History  Administered Date(s) Administered  . Influenza, High Dose Seasonal PF 07/04/2015  . Pneumococcal Conjugate-13 01/30/2014  . Pneumococcal Polysaccharide-23 07/27/2000, 07/23/2005  . Td 08/04/2007  . Zoster 10/31/2008    Health Maintenance  Topic Date Due  . FOOT EXAM  06/03/1948  . OPHTHALMOLOGY EXAM  06/03/1948  . INFLUENZA VACCINE  04/15/2016  . HEMOGLOBIN A1C  11/06/2016  . TETANUS/TDAP  08/03/2017  . DEXA SCAN  Completed  . ZOSTAVAX  Completed  . PNA vac Low Risk Adult  Completed      Discussed health benefits of physical activity, and encouraged her to engage in regular exercise appropriate for her age and condition.    ------------------------------------------------------------------------------------------------------------ 1. Medicare annual wellness visit, subsequent Stable. As above.  2. Diabetic peripheral neuropathy associated with type 2 diabetes mellitus ([employer]) Still on prednisone, and too soon to recheck A1C. FU 2 months. - CBC with Differential/Platelet - Comprehensive metabolic panel  Diabetic Foot Exam - Simple   Simple Foot Form Diabetic Foot exam was performed with the following findings:  Yes 07/09/2016  9:15 AM  Visual Inspection No deformities, no ulcerations, no other skin breakdown bilaterally:  Yes Sensation Testing Intact to touch and monofilament testing  bilaterally:  Yes Pulse Check Posterior Tibialis and Dorsalis pulse intact bilaterally:  Yes Comments     3. Adult hypothyroidism Check labs. FU pending results. - TSH  4. Pure hypercholesterolemia Check labs. FU pending results. - Lipid panel  5. Heart & renal disease, hypertensive, with heart failure (HCC) Stable. Continue current medications.  6. Malignant neoplasm of kidney, unspecified laterality [facility]) [facility] performs yearly MRI for this problem. F/B Dr. Holley Raring.  7. Polymyalgia rheumatica syndrome (HCC) Pain better on Prednisone 10 mg. F/B Rheumatology.  8. Flu vaccine need Administered today. - Flu vaccine HIGH DOSE PF   Patient seen and examined by Miguel Aschoff, MD, and note scribed by Renaldo Fiddler, CMA.  I  have done the exam and reviewed the above chart and it is accurate to the best of my knowledge.  Aysel Gilchrest Cranford Mon, MD  [facility]

## 2016-07-10 ENCOUNTER — Telehealth: Payer: Self-pay

## 2016-07-10 LAB — CBC WITH DIFFERENTIAL/PLATELET
Basophils Absolute: 0 x10E3/uL (ref 0.0–0.2)
Basos: 0 %
EOS (ABSOLUTE): 0.2 x10E3/uL (ref 0.0–0.4)
Eos: 2 %
Hematocrit: 35.9 % (ref 34.0–46.6)
Hemoglobin: 11.8 g/dL (ref 11.1–15.9)
Immature Grans (Abs): 0 x10E3/uL (ref 0.0–0.1)
Immature Granulocytes: 0 %
Lymphocytes Absolute: 2.5 x10E3/uL (ref 0.7–3.1)
Lymphs: 28 %
MCH: 32.4 pg (ref 26.6–33.0)
MCHC: 32.9 g/dL (ref 31.5–35.7)
MCV: 99 fL — ABNORMAL HIGH (ref 79–97)
Monocytes Absolute: 0.9 x10E3/uL (ref 0.1–0.9)
Monocytes: 10 %
Neutrophils Absolute: 5.4 x10E3/uL (ref 1.4–7.0)
Neutrophils: 60 %
Platelets: 325 x10E3/uL (ref 150–379)
RBC: 3.64 x10E6/uL — ABNORMAL LOW (ref 3.77–5.28)
RDW: 12.7 % (ref 12.3–15.4)
WBC: 9 x10E3/uL (ref 3.4–10.8)

## 2016-07-10 LAB — COMPREHENSIVE METABOLIC PANEL WITH GFR
ALT: 14 IU/L (ref 0–32)
AST: 11 IU/L (ref 0–40)
Albumin/Globulin Ratio: 1.8 (ref 1.2–2.2)
Albumin: 4.2 g/dL (ref 3.5–4.8)
Alkaline Phosphatase: 48 IU/L (ref 39–117)
BUN/Creatinine Ratio: 23 (ref 12–28)
BUN: 42 mg/dL — ABNORMAL HIGH (ref 8–27)
Bilirubin Total: 0.9 mg/dL (ref 0.0–1.2)
CO2: 22 mmol/L (ref 18–29)
Calcium: 9.2 mg/dL (ref 8.7–10.3)
Chloride: 102 mmol/L (ref 96–106)
Creatinine, Ser: 1.81 mg/dL — ABNORMAL HIGH (ref 0.57–1.00)
GFR calc Af Amer: 30 mL/min/1.73 — ABNORMAL LOW
GFR calc non Af Amer: 26 mL/min/1.73 — ABNORMAL LOW
Globulin, Total: 2.4 g/dL (ref 1.5–4.5)
Glucose: 155 mg/dL — ABNORMAL HIGH (ref 65–99)
Potassium: 5 mmol/L (ref 3.5–5.2)
Sodium: 140 mmol/L (ref 134–144)
Total Protein: 6.6 g/dL (ref 6.0–8.5)

## 2016-07-10 LAB — LIPID PANEL
CHOL/HDL RATIO: 2.3 ratio (ref 0.0–4.4)
Cholesterol, Total: 213 mg/dL — ABNORMAL HIGH (ref 100–199)
HDL: 92 mg/dL (ref >39)
LDL Calculated: 97 mg/dL (ref 0–99)
TRIGLYCERIDES: 119 mg/dL (ref 0–149)
VLDL Cholesterol Cal: 24 mg/dL (ref 5–40)

## 2016-07-10 LAB — TSH: TSH: 4.07 u[IU]/mL (ref 0.450–4.500)

## 2016-07-10 NOTE — Telephone Encounter (Signed)
Pt advised-aa 

## 2016-07-10 NOTE — Telephone Encounter (Signed)
-----   Message from Jerrol Banana., MD sent at 07/10/2016  7:54 AM EDT ----- Everything stable.Labs to nephrology.

## 2016-07-10 NOTE — Telephone Encounter (Signed)
LMTCB 10/26/217  Thanks,   -Mickel Baas

## 2016-07-10 NOTE — Telephone Encounter (Signed)
Pt is returning call/.  [id number]

## 2016-07-28 DIAGNOSIS — M353 Polymyalgia rheumatica: Secondary | ICD-10-CM | POA: Diagnosis not present

## 2016-07-28 DIAGNOSIS — N184 Chronic kidney disease, stage 4 (severe): Secondary | ICD-10-CM | POA: Diagnosis not present

## 2016-07-28 DIAGNOSIS — Z7952 Long term (current) use of systemic steroids: Secondary | ICD-10-CM | POA: Diagnosis not present

## 2016-07-30 DIAGNOSIS — R809 Proteinuria, unspecified: Secondary | ICD-10-CM | POA: Diagnosis not present

## 2016-07-30 DIAGNOSIS — N2581 Secondary hyperparathyroidism of renal origin: Secondary | ICD-10-CM | POA: Diagnosis not present

## 2016-07-30 DIAGNOSIS — E872 Acidosis: Secondary | ICD-10-CM | POA: Diagnosis not present

## 2016-07-30 DIAGNOSIS — N183 Chronic kidney disease, stage 3 (moderate): Secondary | ICD-10-CM | POA: Diagnosis not present

## 2016-08-04 DIAGNOSIS — N281 Cyst of kidney, acquired: Secondary | ICD-10-CM | POA: Diagnosis not present

## 2016-08-04 DIAGNOSIS — R809 Proteinuria, unspecified: Secondary | ICD-10-CM | POA: Diagnosis not present

## 2016-08-04 DIAGNOSIS — N2581 Secondary hyperparathyroidism of renal origin: Secondary | ICD-10-CM | POA: Diagnosis not present

## 2016-08-04 DIAGNOSIS — E872 Acidosis: Secondary | ICD-10-CM | POA: Diagnosis not present

## 2016-08-04 DIAGNOSIS — N184 Chronic kidney disease, stage 4 (severe): Secondary | ICD-10-CM | POA: Diagnosis not present

## 2016-09-22 ENCOUNTER — Ambulatory Visit (INDEPENDENT_AMBULATORY_CARE_PROVIDER_SITE_OTHER): Payer: Medicare HMO | Admitting: Family Medicine

## 2016-09-22 VITALS — BP 132/84 | HR 80 | Temp 98.6°F | Resp 16 | Wt 231.0 lb

## 2016-09-22 DIAGNOSIS — M5136 Other intervertebral disc degeneration, lumbar region: Secondary | ICD-10-CM | POA: Diagnosis not present

## 2016-09-22 DIAGNOSIS — M353 Polymyalgia rheumatica: Secondary | ICD-10-CM | POA: Diagnosis not present

## 2016-09-22 DIAGNOSIS — E1142 Type 2 diabetes mellitus with diabetic polyneuropathy: Secondary | ICD-10-CM | POA: Diagnosis not present

## 2016-09-22 DIAGNOSIS — E039 Hypothyroidism, unspecified: Secondary | ICD-10-CM

## 2016-09-22 DIAGNOSIS — K219 Gastro-esophageal reflux disease without esophagitis: Secondary | ICD-10-CM

## 2016-09-22 LAB — POCT GLYCOSYLATED HEMOGLOBIN (HGB A1C): HEMOGLOBIN A1C: 8.7

## 2016-09-22 MED ORDER — PIOGLITAZONE HCL 30 MG PO TABS: 30.0000 mg | ORAL_TABLET | Freq: Every day | ORAL | 3 refills | Status: DC

## 2016-09-22 NOTE — Progress Notes (Signed)
Gina Michael  MRN: [id number] DOB: [birth year]  Subjective:  HPI  Patient is here for follow up. Last office visit was 07/09/16 for wellness visit. She has been checking her sugars fasting and readings have been around 115-125. She has not checked her sugar in the past 3 weeks due to her meter broke. She has been on Prednisone that was started by Dr Barb Merino for about 3 months now for polymyalgia. She is having facial swelling since been on this medication. Nu numbness or tingling sensation. Lab Results  Component Value Date   HGBA1C 7.3 05/06/2016   Last labs routine were on 07/09/16. GERD: she takes Omeprazole about 1 to 2 times a month.  Wt Readings from Last 3 Encounters:  09/22/16 231 lb (104.8 kg)  07/09/16 231 lb (104.8 kg)  05/06/16 225 lb (102.1 kg)   Patient Active Problem List   Diagnosis Date Noted  . Polymyalgia rheumatica syndrome ([place]) 04/24/2016  . Diabetes ([place]) 05/09/2015  . Bell palsy 02/19/2015  . Chronic venous insufficiency 02/19/2015  . DDD (degenerative disc disease), lumbar 02/19/2015  . Diabetic neuropathy ([place]) 02/19/2015  . Urinary system disease 02/19/2015  . Edema extremities 02/19/2015  . Bloodgood disease 02/19/2015  . Acid reflux 02/19/2015  . Cephalalgia 02/19/2015  . H/O adenomatous polyp of colon 02/19/2015  . HLD (hyperlipidemia) 02/19/2015  . Heart & renal disease, hypertensive, with heart failure ([place]) 02/19/2015  . Adult hypothyroidism 02/19/2015  . Neuritis or radiculitis due to rupture of lumbar intervertebral disc 02/19/2015  . Lumbar canal stenosis 02/19/2015  . Cancer of kidney ([place]) 02/19/2015  . Adiposity 02/19/2015  . Arthritis, degenerative 02/19/2015  . Impaired renal function 02/19/2015  . Sialoadenitis 02/19/2015  . Diabetic peripheral neuropathy associated with type 2 diabetes mellitus ([place]) 02/19/2015  . Phlebectasia 02/19/2015  . Angiitis ([place]) 02/19/2015  . Avitaminosis D 02/19/2015    No past medical history on  file.  Social History   Social History  . Marital status: Married    Spouse name: Sterling  . Number of children: 2  . Years of education: associates   Occupational History  . retired    Social History Main Topics  . Smoking status: Never Smoker  . Smokeless tobacco: Never Used  . Alcohol use No  . Drug use: No  . Sexual activity: Not Currently   Other Topics Concern  . Not on file   Social History Narrative  . No narrative on file    Outpatient Encounter Prescriptions as of 09/22/2016  Medication Sig Note  . aspirin 81 MG EC tablet Take by mouth. 02/19/2015: Received from: [facility]  . BIOTIN PO Take by mouth daily.   . Cholecalciferol 1000 UNITS TBDP Take by mouth. 02/19/2015: Received from: [facility]  . Docusate Sodium 100 MG capsule Take by mouth. 02/19/2015: Received from: [facility]  . ezetimibe (ZETIA) 10 MG tablet Take 1 tablet (10 mg total) by mouth daily.   Marland Kitchen glimepiride (AMARYL) 4 MG tablet TAKE TWO TABLETS BY MOUTH EVERY DAY   . levothyroxine (SYNTHROID, LEVOTHROID) 88 MCG tablet TAKE ONE TABLET BY MOUTH EVERY DAY   . losartan (COZAAR) 100 MG tablet Take 1 tablet by mouth daily. 05/06/2016: Received from: [facility]  . Omeprazole 20 MG TBEC Take by mouth. 02/19/2015: Received from: [facility]  . pioglitazone (ACTOS) 15 MG tablet TAKE ONE TABLET EVERY MORNING   . predniSONE (DELTASONE) 5 MG tablet Take 2 tabs daily 07/09/2016:  Received from: [facility]  . sodium bicarbonate 650 MG tablet Take by mouth. 02/19/2015: Received from: [facility]  . [DISCONTINUED] ranitidine (ZANTAC) 300 MG capsule Take by mouth. 02/19/2015: Received from: [facility]  . [DISCONTINUED] triamcinolone cream (KENALOG) 0.1 % Apply 1 application topically 2 (two) times daily.    No facility-administered encounter medications on file as of 09/22/2016.      Allergies  Allergen Reactions  . Augmentin  [Amoxicillin-Pot Clavulanate] Anaphylaxis  . Cleocin  [Clindamycin] Anaphylaxis  . Penicillins Anaphylaxis  . Cefprozil   . Citric Acid Sodium  [Sodium Citrate]   . Codeine   . Vancomycin Hcl   . Oxycodone Rash    Review of Systems  Constitutional: Positive for malaise/fatigue.       Swelling in the face since been on the Prednisone  Respiratory: Negative.   Cardiovascular: Negative.   Gastrointestinal: Negative.   Musculoskeletal: Positive for back pain and joint pain.  Skin:       Bruising left side of the back, no injury that she knows of  Neurological: Negative.   Endo/Heme/Allergies: Bruises/bleeds easily.    Objective:  BP 132/84   Pulse 80   Temp 98.6 F (37 C)   Resp 16   Wt 231 lb (104.8 kg)   BMI 42.25 kg/m   Physical Exam  Constitutional: She is oriented to person, place, and time and well-developed, well-nourished, and in no distress.  HENT:  Head: Normocephalic and atraumatic.  Eyes: Conjunctivae are normal. Pupils are equal, round, and reactive to light.  Neck: Normal range of motion. Neck supple.  Cardiovascular: Normal rate, regular rhythm, normal heart sounds and intact distal pulses.   No murmur heard. Pulmonary/Chest: Effort normal and breath sounds normal. No respiratory distress. She has no wheezes.  Neurological: She is alert and oriented to person, place, and time.  Skin:  Bruising left lower back  Psychiatric: Mood, memory, affect and judgment normal.    Assessment and Plan :  1. Diabetic peripheral neuropathy associated with type 2 diabetes mellitus (HCC) A1C 8.7 worse. She is on Prednisone. Will increase Actos to 30 mg.  - POCT HgB A1C  2. Adult hypothyroidism Stable on last check  3. Polymyalgia rheumatica syndrome [facility]) Advised patient to talk to Dr Meda Coffee about prednisone she is on and some side effects she is having.  4. Gastroesophageal reflux disease, esophagitis presence not  specified Stable on rare use of Prilosec.  5. DDD (degenerative disc disease), lumbar Stable. 6. Contusion left lower back Normal exam so I think this is benign. HPI, Exam and A&P transcribed under direction and in the presence of Miguel Aschoff, MD. I have done the exam and reviewed the chart and it is accurate to the best of my knowledge. Development worker, community has been used and  any errors in dictation or transcription are unintentional. Miguel Aschoff M.D. [facility]

## 2016-10-13 DIAGNOSIS — R5382 Chronic fatigue, unspecified: Secondary | ICD-10-CM | POA: Diagnosis not present

## 2016-10-13 DIAGNOSIS — N184 Chronic kidney disease, stage 4 (severe): Secondary | ICD-10-CM | POA: Diagnosis not present

## 2016-10-13 DIAGNOSIS — M353 Polymyalgia rheumatica: Secondary | ICD-10-CM | POA: Diagnosis not present

## 2016-10-13 DIAGNOSIS — Z7952 Long term (current) use of systemic steroids: Secondary | ICD-10-CM | POA: Diagnosis not present

## 2016-10-13 DIAGNOSIS — C642 Malignant neoplasm of left kidney, except renal pelvis: Secondary | ICD-10-CM | POA: Diagnosis not present

## 2016-10-13 DIAGNOSIS — R0609 Other forms of dyspnea: Secondary | ICD-10-CM | POA: Diagnosis not present

## 2016-10-13 DIAGNOSIS — R069 Unspecified abnormalities of breathing: Secondary | ICD-10-CM | POA: Diagnosis not present

## 2016-10-21 ENCOUNTER — Other Ambulatory Visit: Payer: Self-pay | Admitting: Family Medicine

## 2016-10-21 DIAGNOSIS — E785 Hyperlipidemia, unspecified: Secondary | ICD-10-CM

## 2016-11-14 ENCOUNTER — Other Ambulatory Visit: Payer: Self-pay | Admitting: Family Medicine

## 2016-11-17 DIAGNOSIS — R69 Illness, unspecified: Secondary | ICD-10-CM | POA: Diagnosis not present

## 2016-11-18 DIAGNOSIS — R69 Illness, unspecified: Secondary | ICD-10-CM | POA: Diagnosis not present

## 2016-11-20 DIAGNOSIS — Z6841 Body Mass Index (BMI) 40.0 and over, adult: Secondary | ICD-10-CM | POA: Diagnosis not present

## 2016-11-20 DIAGNOSIS — M353 Polymyalgia rheumatica: Secondary | ICD-10-CM | POA: Diagnosis not present

## 2016-11-20 DIAGNOSIS — Z7952 Long term (current) use of systemic steroids: Secondary | ICD-10-CM | POA: Diagnosis not present

## 2016-11-26 ENCOUNTER — Other Ambulatory Visit: Payer: Self-pay | Admitting: Family Medicine

## 2016-11-26 DIAGNOSIS — E785 Hyperlipidemia, unspecified: Secondary | ICD-10-CM

## 2016-12-24 DIAGNOSIS — N2581 Secondary hyperparathyroidism of renal origin: Secondary | ICD-10-CM | POA: Diagnosis not present

## 2016-12-24 DIAGNOSIS — N184 Chronic kidney disease, stage 4 (severe): Secondary | ICD-10-CM | POA: Diagnosis not present

## 2016-12-24 DIAGNOSIS — E872 Acidosis: Secondary | ICD-10-CM | POA: Diagnosis not present

## 2016-12-24 DIAGNOSIS — N183 Chronic kidney disease, stage 3 (moderate): Secondary | ICD-10-CM | POA: Diagnosis not present

## 2017-01-12 DIAGNOSIS — E113393 Type 2 diabetes mellitus with moderate nonproliferative diabetic retinopathy without macular edema, bilateral: Secondary | ICD-10-CM | POA: Diagnosis not present

## 2017-01-20 ENCOUNTER — Ambulatory Visit (INDEPENDENT_AMBULATORY_CARE_PROVIDER_SITE_OTHER): Payer: Medicare HMO | Admitting: Family Medicine

## 2017-01-20 VITALS — BP 118/62 | HR 76 | Temp 98.1°F | Resp 18 | Wt 243.0 lb

## 2017-01-20 DIAGNOSIS — E039 Hypothyroidism, unspecified: Secondary | ICD-10-CM | POA: Diagnosis not present

## 2017-01-20 DIAGNOSIS — E7800 Pure hypercholesterolemia, unspecified: Secondary | ICD-10-CM | POA: Diagnosis not present

## 2017-01-20 DIAGNOSIS — E1142 Type 2 diabetes mellitus with diabetic polyneuropathy: Secondary | ICD-10-CM

## 2017-01-20 DIAGNOSIS — M353 Polymyalgia rheumatica: Secondary | ICD-10-CM

## 2017-01-20 DIAGNOSIS — Z7952 Long term (current) use of systemic steroids: Secondary | ICD-10-CM | POA: Diagnosis not present

## 2017-01-20 DIAGNOSIS — Z6841 Body Mass Index (BMI) 40.0 and over, adult: Secondary | ICD-10-CM | POA: Diagnosis not present

## 2017-01-20 LAB — POCT GLYCOSYLATED HEMOGLOBIN (HGB A1C): Hemoglobin A1C: 6.6

## 2017-01-20 NOTE — Progress Notes (Signed)
Gina Michael  MRN: [id number] DOB: 10/04/1935  Subjective:  HPI  Patient is here for 4 months follow up. Diabetes: on last visit on 09/22/16 Actos was increased to 30 mg due to A1C level. Patient was on Prednisone at that time. Patient is weaning off the Prednisone at this time. She has gained 12 lbs weight, feels short winded more in the past month. She has had a couple of times of having hypoglycemic episodes and its usually when she eats breakfast early and eats lunch too late. Her fasting reading for sugar has been 79-103 and after meal 170s. Lab Results  Component Value Date   HGBA1C 8.7 09/22/2016   Last lab work was done on 07/09/16-stable. Wt Readings from Last 3 Encounters:  01/20/17 243 lb (110.2 kg)  09/22/16 231 lb (104.8 kg)  07/09/16 231 lb (104.8 kg)   BP Readings from Last 3 Encounters:  01/20/17 118/62  09/22/16 132/84  07/09/16 136/72    Patient Active Problem List   Diagnosis Date Noted  . Polymyalgia rheumatica syndrome ([place]) 04/24/2016  . Diabetes ([place]) 05/09/2015  . Bell palsy 02/19/2015  . Chronic venous insufficiency 02/19/2015  . DDD (degenerative disc disease), lumbar 02/19/2015  . Diabetic neuropathy ([place]) 02/19/2015  . Urinary system disease 02/19/2015  . Edema extremities 02/19/2015  . Bloodgood disease 02/19/2015  . Acid reflux 02/19/2015  . Cephalalgia 02/19/2015  . H/O adenomatous polyp of colon 02/19/2015  . HLD (hyperlipidemia) 02/19/2015  . Heart & renal disease, hypertensive, with heart failure ([place]) 02/19/2015  . Adult hypothyroidism 02/19/2015  . Neuritis or radiculitis due to rupture of lumbar intervertebral disc 02/19/2015  . Lumbar canal stenosis 02/19/2015  . Cancer of kidney ([place]) 02/19/2015  . Adiposity 02/19/2015  . Arthritis, degenerative 02/19/2015  . Impaired renal function 02/19/2015  . Sialoadenitis 02/19/2015  . Diabetic peripheral neuropathy associated with type 2 diabetes mellitus ([place]) 02/19/2015  . Phlebectasia  02/19/2015  . Angiitis ([place]) 02/19/2015  . Avitaminosis D 02/19/2015    No past medical history on file.  Social History   Social History  . Marital status: Married    Spouse name: Sterling  . Number of children: 2  . Years of education: associates   Occupational History  . retired    Social History Main Topics  . Smoking status: Never Smoker  . Smokeless tobacco: Never Used  . Alcohol use No  . Drug use: No  . Sexual activity: Not Currently   Other Topics Concern  . Not on file   Social History Narrative  . No narrative on file    Outpatient Encounter Prescriptions as of 01/20/2017  Medication Sig Note  . aspirin 81 MG EC tablet Take by mouth. 02/19/2015: Received from: [facility]  . BIOTIN PO Take by mouth daily.   . Cholecalciferol 1000 UNITS TBDP Take by mouth. 02/19/2015: Received from: [facility]  . Docusate Sodium 100 MG capsule Take by mouth. 02/19/2015: Received from: [facility]  . ezetimibe (ZETIA) 10 MG tablet Take 1 tablet (10 mg total) by mouth daily.   Marland Kitchen glimepiride (AMARYL) 4 MG tablet TAKE TWO TABLETS BY MOUTH EVERY DAY   . levothyroxine (SYNTHROID, LEVOTHROID) 88 MCG tablet TAKE ONE TABLET BY MOUTH EVERY DAY   . losartan (COZAAR) 100 MG tablet Take 1 tablet by mouth daily. 05/06/2016: Received from: [facility]  . Omeprazole 20 MG TBEC Take by mouth. 02/19/2015: Received from: [facility]  . [facility] DELICA  LANCETS 33G MISC CHECK SUGAR ONCE A DAY   . pioglitazone (ACTOS) 30 MG tablet Take 1 tablet (30 mg total) by mouth daily.   . predniSONE (DELTASONE) 5 MG tablet Take 2 tabs daily 07/09/2016: Received from: [facility]  . sodium bicarbonate 650 MG tablet Take by mouth. 02/19/2015: Received from: [facility]  . [DISCONTINUED] ezetimibe (ZETIA) 10 MG tablet TAKE 1 TABLET BY MOUTH DAILY    No facility-administered encounter medications on  file as of 01/20/2017.     Allergies  Allergen Reactions  . Augmentin  [Amoxicillin-Pot Clavulanate] Anaphylaxis  . Cleocin  [Clindamycin] Anaphylaxis  . Penicillins Anaphylaxis  . Cefprozil   . Citric Acid Sodium  [Sodium Citrate]   . Codeine   . Vancomycin Hcl   . Oxycodone Rash    Review of Systems  Constitutional: Positive for malaise/fatigue.  Respiratory: Positive for shortness of breath.   Cardiovascular: Positive for leg swelling.  Gastrointestinal: Negative.   Musculoskeletal: Positive for joint pain.  Neurological: Negative.     Objective:  BP 118/62   Pulse 76   Temp 98.1 F (36.7 C)   Resp 18   Wt 243 lb (110.2 kg)   SpO2 97%   BMI 44.45 kg/m   Physical Exam  Constitutional: She is oriented to person, place, and time and well-developed, well-nourished, and in no distress.  HENT:  Head: Normocephalic and atraumatic.  Eyes: Conjunctivae are normal. Pupils are equal, round, and reactive to light.  Neck: Normal range of motion. Neck supple.  Cardiovascular: Normal rate, regular rhythm, normal heart sounds and intact distal pulses.   No murmur heard. Pulmonary/Chest: Effort normal and breath sounds normal. No respiratory distress. She has no wheezes.  Musculoskeletal: She exhibits edema (1+).  Neurological: She is alert and oriented to person, place, and time.  Psychiatric: Mood, memory, affect and judgment normal.   Assessment and Plan :  1. Diabetic peripheral neuropathy associated with type 2 diabetes mellitus (HCC) A1C 6.6. Much better. Continue current medication.  2. Adult hypothyroidism  3. Polymyalgia rheumatica syndrome (HCC) Following Dr Meda Coffee. Advised patient to discuss the treatment for this and what is the best decision for her at this time.  4. Pure hypercholesterolemia 5. BMI 40.0-44.9, adult (HCC) Edema Advised patient to wear compression stockings. Patient has 1 kidney. Diuretic is not ideal for the patient. Advised patient Actos could  be contributing to this issue but sugar is much better and patient wants to continue this medication without a change at this time time.  HPI, Exam and A&P transcribed by Theressa Millard, RMA under direction and in the presence of Miguel Aschoff, MD. I have done the exam and reviewed the chart and it is accurate to the best of my knowledge. Development worker, community has been used and  any errors in dictation or transcription are unintentional. Miguel Aschoff M.D. [facility]

## 2017-01-29 DIAGNOSIS — M5136 Other intervertebral disc degeneration, lumbar region: Secondary | ICD-10-CM | POA: Diagnosis not present

## 2017-01-29 DIAGNOSIS — N184 Chronic kidney disease, stage 4 (severe): Secondary | ICD-10-CM | POA: Diagnosis not present

## 2017-01-29 DIAGNOSIS — M353 Polymyalgia rheumatica: Secondary | ICD-10-CM | POA: Diagnosis not present

## 2017-01-29 DIAGNOSIS — M2550 Pain in unspecified joint: Secondary | ICD-10-CM | POA: Diagnosis not present

## 2017-01-29 DIAGNOSIS — Z6841 Body Mass Index (BMI) 40.0 and over, adult: Secondary | ICD-10-CM | POA: Diagnosis not present

## 2017-02-02 DIAGNOSIS — Z1231 Encounter for screening mammogram for malignant neoplasm of breast: Secondary | ICD-10-CM | POA: Diagnosis not present

## 2017-02-24 ENCOUNTER — Ambulatory Visit (INDEPENDENT_AMBULATORY_CARE_PROVIDER_SITE_OTHER): Payer: Medicare HMO | Admitting: Family Medicine

## 2017-02-24 ENCOUNTER — Encounter: Payer: Self-pay | Admitting: Family Medicine

## 2017-02-24 VITALS — BP 142/82 | HR 80 | Temp 98.4°F | Resp 16 | Wt 246.0 lb

## 2017-02-24 DIAGNOSIS — I872 Venous insufficiency (chronic) (peripheral): Secondary | ICD-10-CM | POA: Diagnosis not present

## 2017-02-24 DIAGNOSIS — E1121 Type 2 diabetes mellitus with diabetic nephropathy: Secondary | ICD-10-CM | POA: Diagnosis not present

## 2017-02-24 LAB — GLUCOSE, POCT (MANUAL RESULT ENTRY): POC Glucose: 89 mg/dl (ref 70–99)

## 2017-02-24 MED ORDER — GLIMEPIRIDE 4 MG PO TABS: 4.0000 mg | ORAL_TABLET | Freq: Every day | ORAL | 11 refills | Status: DC

## 2017-02-24 NOTE — Patient Instructions (Signed)
Decrease Glimepiride to 1 tablet daily. Check your sugars and keep log. If you still experience low blood sugar incidents in one week, call to schedule an appointment with Dr. Rosanna Randy. Otherwise keep follow up appointment on 05/26/2017.

## 2017-02-24 NOTE — Progress Notes (Signed)
Patient: Gina Michael Female    DOB: Feb 06, 1936   81 y.o.   MRN: [id number] Visit Date: 02/24/2017  Today's Provider: Wilhemena Durie, MD   Chief Complaint  Patient presents with  . Diabetes   Subjective:    HPI     Results for orders placed or performed in visit on 02/24/17  POCT glucose (manual entry)  Result Value Ref Range   POC Glucose 89 70 - 99 mg/dl     Diabetes Mellitus Type II, Follow-up:   Lab Results  Component Value Date   HGBA1C 6.6 01/20/2017   HGBA1C 8.7 09/22/2016   HGBA1C 7.3 05/06/2016    Last seen for diabetes 1 months ago.  Management since then includes continuing Glimepiride 4 mg 2 tablets daily and Actos 30 mg po qd. She reports good compliance with treatment. She is not having side effects.  Current symptoms include hypoglycemia  and visual disturbances and have been worsening. Home blood sugar records: has not been checked since having hypoglycemic sx  Episodes of hypoglycemia? yes - in the last 4-5 days. Pt has woken up with sweats, tremors. Pt recently D/C her Gabapentin prior to having these low blood sugars.   Weight trend: increasing steadily Current diet: Pt has started a diet; her friend has started weight watchers and has told her the foods to eat Current exercise: walking 15 minutes 2 times a day; has decreased this exercise recently due to swelling of legs.  Pertinent Labs:    Component Value Date/Time   CHOL 213 (H) 07/09/2016 0938   TRIG 119 07/09/2016 0938   HDL 92 07/09/2016 0938   LDLCALC 97 07/09/2016 0938   CREATININE 1.81 (H) 07/09/2016 0938   CREATININE 1.85 (H) 09/22/2011 1937    Wt Readings from Last 3 Encounters:  02/24/17 246 lb (111.6 kg)  01/20/17 243 lb (110.2 kg)  09/22/16 231 lb (104.8 kg)    ------------------------------------------------------------------------   Allergies  Allergen Reactions  . Augmentin  [Amoxicillin-Pot Clavulanate] Anaphylaxis  . Cleocin  [Clindamycin]  Anaphylaxis  . Penicillins Anaphylaxis  . Cefprozil   . Citric Acid Sodium  [Sodium Citrate]   . Codeine   . Vancomycin Hcl   . Oxycodone Rash     Current Outpatient Prescriptions:  .  aspirin 81 MG EC tablet, Take by mouth., Disp: , Rfl:  .  BIOTIN PO, Take by mouth daily., Disp: , Rfl:  .  Cholecalciferol 1000 UNITS TBDP, Take by mouth., Disp: , Rfl:  .  ezetimibe (ZETIA) 10 MG tablet, Take 1 tablet (10 mg total) by mouth daily., Disp: 30 tablet, Rfl: 12 .  glimepiride (AMARYL) 4 MG tablet, Take 1 tablet (4 mg total) by mouth daily., Disp: 30 tablet, Rfl: 11 .  levothyroxine (SYNTHROID, LEVOTHROID) 88 MCG tablet, TAKE ONE TABLET BY MOUTH EVERY DAY, Disp: 30 tablet, Rfl: 12 .  losartan (COZAAR) 100 MG tablet, Take 1 tablet by mouth daily., Disp: , Rfl:  .  Omeprazole 20 MG TBEC, Take by mouth., Disp: , Rfl:  .  ONETOUCH DELICA LANCETS 38V MISC, CHECK SUGAR ONCE A DAY, Disp: 100 each, Rfl: 12 .  pioglitazone (ACTOS) 30 MG tablet, Take 1 tablet (30 mg total) by mouth daily., Disp: 90 tablet, Rfl: 3 .  sodium bicarbonate 650 MG tablet, Take 1,300 mg by mouth 2 (two) times daily. , Disp: , Rfl:  .  Docusate Sodium 100 MG capsule, Take by mouth., Disp: , Rfl:   Review  of Systems  Constitutional: Positive for diaphoresis and unexpected weight change. Negative for activity change, appetite change, chills, fatigue and fever.  Eyes: Positive for visual disturbance.  Respiratory: Negative for shortness of breath.   Cardiovascular: Positive for leg swelling. Negative for chest pain.  Endocrine: Negative for polydipsia, polyphagia and polyuria.  Neurological: Negative for syncope.    Social History  Substance Use Topics  . Smoking status: Never Smoker  . Smokeless tobacco: Never Used  . Alcohol use No   Objective:   BP (!) 142/82 (BP Location: Left Arm, Patient Position: Sitting, Cuff Size: Large)   Pulse 80   Temp 98.4 F (36.9 C) (Oral)   Resp 16   Wt 246 lb (111.6 kg)   BMI  44.99 kg/m  Vitals:   02/24/17 1630  BP: (!) 142/82  Pulse: 80  Resp: 16  Temp: 98.4 F (36.9 C)  TempSrc: Oral  Weight: 246 lb (111.6 kg)     Physical Exam  Constitutional: She appears well-developed and well-nourished.  HENT:  Head: Normocephalic and atraumatic.  Eyes: Conjunctivae are normal.  Cardiovascular: Normal rate, regular rhythm and normal heart sounds.   Pulmonary/Chest: Effort normal and breath sounds normal. No respiratory distress.  Abdominal: Soft.  Musculoskeletal: She exhibits edema (trace).  Skin: Skin is warm and dry.  Psychiatric: She has a normal mood and affect. Her behavior is normal. Judgment and thought content normal.        Assessment & Plan:     1. Type 2 diabetes mellitus with diabetic nephropathy, without long-term current use of insulin (HCC) Random glucose 89. Will decrease Glimepiride to 1 tablet daily due to hypoglycemia. FU in 1 week if sx do not improve. Otherwise keep diabetes FU. - POCT glucose (manual entry) - glimepiride (AMARYL) 4 MG tablet; Take 1 tablet (4 mg total) by mouth daily.  Dispense: 30 tablet; Refill: 11  2. Chronic venous insufficiency Worsening. Pt is c/o edema. Wrote rx for knee high support stockings. 3.CKD     I have done the exam and reviewed the above chart and it is accurate to the best of my knowledge. Development worker, community has been used in this note in any air is in the dictation or transcription are unintentional.  Wilhemena Durie, MD  [place]

## 2017-04-29 DIAGNOSIS — N2581 Secondary hyperparathyroidism of renal origin: Secondary | ICD-10-CM | POA: Diagnosis not present

## 2017-04-29 DIAGNOSIS — N184 Chronic kidney disease, stage 4 (severe): Secondary | ICD-10-CM | POA: Diagnosis not present

## 2017-04-29 DIAGNOSIS — E872 Acidosis: Secondary | ICD-10-CM | POA: Diagnosis not present

## 2017-04-29 DIAGNOSIS — R809 Proteinuria, unspecified: Secondary | ICD-10-CM | POA: Diagnosis not present

## 2017-05-11 DIAGNOSIS — E113393 Type 2 diabetes mellitus with moderate nonproliferative diabetic retinopathy without macular edema, bilateral: Secondary | ICD-10-CM | POA: Diagnosis not present

## 2017-05-18 ENCOUNTER — Other Ambulatory Visit: Payer: Self-pay | Admitting: Family Medicine

## 2017-05-26 ENCOUNTER — Encounter: Payer: Self-pay | Admitting: Family Medicine

## 2017-05-26 ENCOUNTER — Ambulatory Visit (INDEPENDENT_AMBULATORY_CARE_PROVIDER_SITE_OTHER): Payer: Medicare HMO | Admitting: Family Medicine

## 2017-05-26 VITALS — BP 124/64 | HR 80 | Temp 97.5°F | Resp 14 | Wt 238.0 lb

## 2017-05-26 DIAGNOSIS — E7800 Pure hypercholesterolemia, unspecified: Secondary | ICD-10-CM | POA: Diagnosis not present

## 2017-05-26 DIAGNOSIS — E039 Hypothyroidism, unspecified: Secondary | ICD-10-CM | POA: Diagnosis not present

## 2017-05-26 DIAGNOSIS — E1142 Type 2 diabetes mellitus with diabetic polyneuropathy: Secondary | ICD-10-CM

## 2017-05-26 DIAGNOSIS — Z23 Encounter for immunization: Secondary | ICD-10-CM | POA: Diagnosis not present

## 2017-05-26 DIAGNOSIS — E1121 Type 2 diabetes mellitus with diabetic nephropathy: Secondary | ICD-10-CM | POA: Diagnosis not present

## 2017-05-26 LAB — CBC WITH DIFFERENTIAL/PLATELET
BASOS PCT: 0.7 %
Basophils Absolute: 48 cells/uL (ref 0–200)
EOS PCT: 4 %
Eosinophils Absolute: 272 cells/uL (ref 15–500)
HEMATOCRIT: 32 % — AB (ref 35.0–45.0)
HEMOGLOBIN: 10.7 g/dL — AB (ref 11.7–15.5)
LYMPHS ABS: 1401 {cells}/uL (ref 850–3900)
MCH: 32.1 pg (ref 27.0–33.0)
MCHC: 33.4 g/dL (ref 32.0–36.0)
MCV: 96.1 fL (ref 80.0–100.0)
MPV: 9.6 fL (ref 7.5–12.5)
Monocytes Relative: 9.3 %
NEUTROS ABS: 4447 {cells}/uL (ref 1500–7800)
Neutrophils Relative %: 65.4 %
Platelets: 295 10*3/uL (ref 140–400)
RBC: 3.33 10*6/uL — AB (ref 3.80–5.10)
RDW: 12.4 % (ref 11.0–15.0)
Total Lymphocyte: 20.6 %
WBC: 6.8 10*3/uL (ref 3.8–10.8)
WBCMIX: 632 {cells}/uL (ref 200–950)

## 2017-05-26 LAB — COMPREHENSIVE METABOLIC PANEL
AG Ratio: 1.6 (calc) (ref 1.0–2.5)
ALBUMIN MSPROF: 4.3 g/dL (ref 3.6–5.1)
ALKALINE PHOSPHATASE (APISO): 46 U/L (ref 33–130)
ALT: 8 U/L (ref 6–29)
AST: 12 U/L (ref 10–35)
BILIRUBIN TOTAL: 0.7 mg/dL (ref 0.2–1.2)
BUN/Creatinine Ratio: 31 (calc) — ABNORMAL HIGH (ref 6–22)
BUN: 59 mg/dL — AB (ref 7–25)
CALCIUM: 9.8 mg/dL (ref 8.6–10.4)
CHLORIDE: 108 mmol/L (ref 98–110)
CO2: 21 mmol/L (ref 20–32)
CREATININE: 1.91 mg/dL — AB (ref 0.60–0.93)
Globulin: 2.7 g/dL (calc) (ref 1.9–3.7)
Glucose, Bld: 80 mg/dL (ref 65–99)
POTASSIUM: 5.4 mmol/L — AB (ref 3.5–5.3)
Sodium: 140 mmol/L (ref 135–146)
Total Protein: 7 g/dL (ref 6.1–8.1)

## 2017-05-26 LAB — LIPID PANEL
Cholesterol: 146 mg/dL (ref <200)
HDL: 61 mg/dL (ref >50)
LDL CHOLESTEROL (CALC): 69 mg/dL
NON-HDL CHOLESTEROL (CALC): 85 mg/dL (ref <130)
TRIGLYCERIDES: 78 mg/dL (ref <150)
Total CHOL/HDL Ratio: 2.4 (calc) (ref <5.0)

## 2017-05-26 LAB — TSH: TSH: 3.64 m[IU]/L (ref 0.40–4.50)

## 2017-05-26 LAB — POCT GLYCOSYLATED HEMOGLOBIN (HGB A1C): HEMOGLOBIN A1C: 6.4

## 2017-05-26 NOTE — Progress Notes (Signed)
Patient: Gina Michael Female    DOB: [birth year]   81 y.o.   MRN: [id number] Visit Date: 05/26/2017  Today's Provider: Wilhemena Durie, MD   Chief Complaint  Patient presents with  . Diabetes   Subjective:    HPI  Diabetes Mellitus Type II, Follow-up:   Lab Results  Component Value Date   HGBA1C 6.6 01/20/2017   HGBA1C 8.7 09/22/2016   HGBA1C 7.3 05/06/2016    Last seen for diabetes 4 months ago.  Management since then includes decreased Glimepiride to once daily. She reports good compliance with treatment. She is not having side effects.  Home blood sugar records: 89-112  Episodes of hypoglycemia? 1, but she thinks she was without food for too long.    Current Insulin Regimen: n/a Most Recent Eye Exam: 05/11/17 normal Current exercise: walking 5 days a week.   Pertinent Labs:    Component Value Date/Time   CHOL 213 (H) 07/09/2016 0938   TRIG 119 07/09/2016 0938   HDL 92 07/09/2016 0938   LDLCALC 97 07/09/2016 0938   CREATININE 1.81 (H) 07/09/2016 0938   CREATININE 1.85 (H) 09/22/2011 1937    Wt Readings from Last 3 Encounters:  05/26/17 238 lb (108 kg)  02/24/17 246 lb (111.6 kg)  01/20/17 243 lb (110.2 kg)   ------------------------------------------------------------------------    Allergies  Allergen Reactions  . Augmentin  [Amoxicillin-Pot Clavulanate] Anaphylaxis  . Cleocin  [Clindamycin] Anaphylaxis  . Penicillins Anaphylaxis  . Cefprozil   . Citric Acid Sodium  [Sodium Citrate]   . Codeine   . Vancomycin Hcl   . Oxycodone Rash     Current Outpatient Prescriptions:  .  aspirin 81 MG EC tablet, Take by mouth., Disp: , Rfl:  .  BIOTIN PO, Take by mouth daily., Disp: , Rfl:  .  Cholecalciferol 1000 UNITS TBDP, Take by mouth., Disp: , Rfl:  .  Docusate Sodium 100 MG capsule, Take by mouth., Disp: , Rfl:  .  ezetimibe (ZETIA) 10 MG tablet, Take 1 tablet (10 mg total) by mouth daily., Disp: 30 tablet, Rfl: 12 .  glimepiride (AMARYL)  4 MG tablet, Take 1 tablet (4 mg total) by mouth daily., Disp: 30 tablet, Rfl: 11 .  levothyroxine (SYNTHROID, LEVOTHROID) 88 MCG tablet, TAKE ONE TABLET BY MOUTH EVERY DAY, Disp: 30 tablet, Rfl: 12 .  losartan (COZAAR) 100 MG tablet, Take 1 tablet by mouth daily., Disp: , Rfl:  .  Omeprazole 20 MG TBEC, Take by mouth., Disp: , Rfl:  .  ONETOUCH DELICA LANCETS 84X MISC, CHECK SUGAR ONCE A DAY, Disp: 100 each, Rfl: 12 .  pioglitazone (ACTOS) 30 MG tablet, Take 1 tablet (30 mg total) by mouth daily., Disp: 90 tablet, Rfl: 3 .  sodium bicarbonate 650 MG tablet, Take 1,300 mg by mouth 2 (two) times daily. , Disp: , Rfl:   Review of Systems  Constitutional: Negative.   HENT: Negative.   Eyes: Negative.   Respiratory: Negative.   Cardiovascular: Negative.   Gastrointestinal: Negative.   Endocrine: Negative.   Genitourinary: Negative.   Musculoskeletal: Negative.   Skin: Negative.   Allergic/Immunologic: Negative.   Neurological: Negative.   Hematological: Negative.   Psychiatric/Behavioral: Negative.     Social History  Substance Use Topics  . Smoking status: Never Smoker  . Smokeless tobacco: Never Used  . Alcohol use No   Objective:   BP 124/64 (BP Location: Left Arm, Patient Position: Sitting, Cuff Size: Large)  Pulse 80   Temp (!) 97.5 F (36.4 C) (Oral)   Resp 14   Wt 238 lb (108 kg)   BMI 43.53 kg/m  Vitals:   05/26/17 0833  BP: 124/64  Pulse: 80  Resp: 14  Temp: (!) 97.5 F (36.4 C)  TempSrc: Oral  Weight: 238 lb (108 kg)     Physical Exam  Constitutional: She is oriented to person, place, and time. She appears well-developed and well-nourished.  HENT:  Head: Normocephalic and atraumatic.  Eyes: Pupils are equal, round, and reactive to light. Conjunctivae and EOM are normal.  Neck: Normal range of motion. Neck supple.  Cardiovascular: Normal rate, regular rhythm, normal heart sounds and intact distal pulses.   Pulmonary/Chest: Effort normal and breath  sounds normal.  Abdominal: Soft.  Musculoskeletal: Normal range of motion. She exhibits edema (1 + ankle edema).  Neurological: She is alert and oriented to person, place, and time. She has normal reflexes.  Skin: Skin is warm and dry.  Psychiatric: She has a normal mood and affect. Her behavior is normal. Judgment and thought content normal.   Diabetic Foot Exam - Simple   Simple Foot Form Diabetic Foot exam was performed with the following findings:  Yes 05/26/2017  9:00 AM  Visual Inspection No deformities, no ulcerations, no other skin breakdown bilaterally:  Yes Sensation Testing Intact to touch and monofilament testing bilaterally:  Yes Pulse Check Posterior Tibialis and Dorsalis pulse intact bilaterally:  Yes Comments        Assessment & Plan:     1. Type 2 diabetes mellitus with diabetic nephropathy, without long-term current use of insulin (HCC)  - POCT HgB A1C 6.4 today. Improving. Continue diet and exercise.   2. Flu vaccine need  - Flu vaccine HIGH DOSE PF  3. Need for pneumococcal vaccination  - Pneumococcal polysaccharide vaccine 23-valent greater than or equal to 2yo subcutaneous/IM  4. Adult hypothyroidism  - TSH  5. Pure hypercholesterolemia  - Lipid panel - Comprehensive metabolic panel  6. Diabetic peripheral neuropathy associated with type 2 diabetes mellitus (HCC)  - CBC with Differential/Platelet     HPI, Exam, and A&P Transcribed under the direction and in the presence of Worthington Cruzan L. Cranford Mon, MD  Electronically Signed: Katina Dung, CMA  I have done the exam and reviewed the above chart and it is accurate to the best of my knowledge. Development worker, community has been used in this note in any air is in the dictation or transcription are unintentional.  Wilhemena Durie, MD  [place]

## 2017-05-27 ENCOUNTER — Telehealth: Payer: Self-pay

## 2017-05-27 NOTE — Telephone Encounter (Signed)
Pt advised.kw 

## 2017-05-27 NOTE — Telephone Encounter (Signed)
-----   Message from Jerrol Banana., MD sent at 05/27/2017 11:45 AM EDT ----- stable

## 2017-06-16 ENCOUNTER — Other Ambulatory Visit: Payer: Self-pay | Admitting: Family Medicine

## 2017-09-02 DIAGNOSIS — D631 Anemia in chronic kidney disease: Secondary | ICD-10-CM | POA: Diagnosis not present

## 2017-09-02 DIAGNOSIS — E872 Acidosis: Secondary | ICD-10-CM | POA: Diagnosis not present

## 2017-09-02 DIAGNOSIS — N2581 Secondary hyperparathyroidism of renal origin: Secondary | ICD-10-CM | POA: Diagnosis not present

## 2017-09-02 DIAGNOSIS — N184 Chronic kidney disease, stage 4 (severe): Secondary | ICD-10-CM | POA: Diagnosis not present

## 2017-10-08 DIAGNOSIS — R809 Proteinuria, unspecified: Secondary | ICD-10-CM | POA: Diagnosis not present

## 2017-10-08 DIAGNOSIS — N281 Cyst of kidney, acquired: Secondary | ICD-10-CM | POA: Diagnosis not present

## 2017-10-08 DIAGNOSIS — E872 Acidosis: Secondary | ICD-10-CM | POA: Diagnosis not present

## 2017-10-08 DIAGNOSIS — N2581 Secondary hyperparathyroidism of renal origin: Secondary | ICD-10-CM | POA: Diagnosis not present

## 2017-10-08 DIAGNOSIS — N184 Chronic kidney disease, stage 4 (severe): Secondary | ICD-10-CM | POA: Diagnosis not present

## 2017-10-22 ENCOUNTER — Other Ambulatory Visit: Payer: Self-pay | Admitting: Family Medicine

## 2017-10-22 DIAGNOSIS — E785 Hyperlipidemia, unspecified: Secondary | ICD-10-CM

## 2017-10-27 ENCOUNTER — Ambulatory Visit: Payer: Self-pay | Admitting: Family Medicine

## 2017-10-28 DIAGNOSIS — R918 Other nonspecific abnormal finding of lung field: Secondary | ICD-10-CM | POA: Diagnosis not present

## 2017-10-28 DIAGNOSIS — Z905 Acquired absence of kidney: Secondary | ICD-10-CM | POA: Diagnosis not present

## 2017-10-28 DIAGNOSIS — N183 Chronic kidney disease, stage 3 (moderate): Secondary | ICD-10-CM | POA: Diagnosis not present

## 2017-10-28 DIAGNOSIS — R9389 Abnormal findings on diagnostic imaging of other specified body structures: Secondary | ICD-10-CM | POA: Diagnosis not present

## 2017-10-28 DIAGNOSIS — Z85528 Personal history of other malignant neoplasm of kidney: Secondary | ICD-10-CM | POA: Diagnosis not present

## 2017-10-29 DIAGNOSIS — N184 Chronic kidney disease, stage 4 (severe): Secondary | ICD-10-CM | POA: Diagnosis not present

## 2017-10-29 DIAGNOSIS — E872 Acidosis: Secondary | ICD-10-CM | POA: Diagnosis not present

## 2017-10-29 DIAGNOSIS — R809 Proteinuria, unspecified: Secondary | ICD-10-CM | POA: Diagnosis not present

## 2017-10-29 DIAGNOSIS — N2581 Secondary hyperparathyroidism of renal origin: Secondary | ICD-10-CM | POA: Diagnosis not present

## 2017-10-29 DIAGNOSIS — N281 Cyst of kidney, acquired: Secondary | ICD-10-CM | POA: Diagnosis not present

## 2017-10-29 LAB — BASIC METABOLIC PANEL
BUN: 49 — AB (ref 4–21)
Creatinine: 2.2 — AB (ref 0.5–1.1)
Glucose: 49
POTASSIUM: 5.3 (ref 3.4–5.3)
SODIUM: 143 (ref 137–147)

## 2017-11-06 ENCOUNTER — Encounter: Payer: Self-pay | Admitting: Emergency Medicine

## 2017-11-06 NOTE — Progress Notes (Signed)
Abstracted labs from [facility] that was faxed over.

## 2017-11-10 ENCOUNTER — Other Ambulatory Visit: Payer: Self-pay

## 2017-11-10 ENCOUNTER — Ambulatory Visit (INDEPENDENT_AMBULATORY_CARE_PROVIDER_SITE_OTHER): Payer: Medicare HMO | Admitting: Family Medicine

## 2017-11-10 VITALS — BP 122/64 | HR 84 | Temp 98.0°F | Resp 16 | Wt 245.0 lb

## 2017-11-10 DIAGNOSIS — E1142 Type 2 diabetes mellitus with diabetic polyneuropathy: Secondary | ICD-10-CM | POA: Diagnosis not present

## 2017-11-10 DIAGNOSIS — N289 Disorder of kidney and ureter, unspecified: Secondary | ICD-10-CM | POA: Diagnosis not present

## 2017-11-10 NOTE — Patient Instructions (Signed)
Stop Amaryl, Glimeperide  Continue with Actos, Pioglitazone

## 2017-11-10 NOTE — Progress Notes (Signed)
Gina Michael  MRN: [id number] DOB: 1935-10-11  Subjective:  HPI  The patient is a 82 year old female who presents for follow up of chronic issues.  She was last seen on 05/26/17.   Diabetes-At the time of her last visit her A1C was 6.4 which was better than the previous 6.6 in May. She checks her glucose at home and has been getting low readings.  Her range has been 58-108.  She states she does get symptomatic with the lower readings.  She states she is caring for her husband and the interval between eating is very irregular.    She had a CT with Dr Holley Raring and he would like for [place] to get another Met C done today.  She had one done after her CT and her BUN and Creatinine were still higher than her normal.    Patient Active Problem List   Diagnosis Date Noted  . Polymyalgia rheumatica syndrome ([place]) 04/24/2016  . Diabetes ([place]) 05/09/2015  . Bell palsy 02/19/2015  . Chronic venous insufficiency 02/19/2015  . DDD (degenerative disc disease), lumbar 02/19/2015  . Diabetic neuropathy ([place]) 02/19/2015  . Urinary system disease 02/19/2015  . Edema extremities 02/19/2015  . Bloodgood disease 02/19/2015  . Acid reflux 02/19/2015  . Cephalalgia 02/19/2015  . H/O adenomatous polyp of colon 02/19/2015  . HLD (hyperlipidemia) 02/19/2015  . Heart & renal disease, hypertensive, with heart failure ([place]) 02/19/2015  . Adult hypothyroidism 02/19/2015  . Neuritis or radiculitis due to rupture of lumbar intervertebral disc 02/19/2015  . Lumbar canal stenosis 02/19/2015  . Cancer of kidney ([place]) 02/19/2015  . Adiposity 02/19/2015  . Arthritis, degenerative 02/19/2015  . Impaired renal function 02/19/2015  . Sialoadenitis 02/19/2015  . Diabetic peripheral neuropathy associated with type 2 diabetes mellitus ([place]) 02/19/2015  . Phlebectasia 02/19/2015  . Angiitis ([place]) 02/19/2015  . Avitaminosis D 02/19/2015    No past medical history on file.  Social History   Socioeconomic History  .  Marital status: Married    Spouse name: Sterling  . Number of children: 2  . Years of education: associates  . Highest education level: Not on file  Social Needs  . Financial resource strain: Not on file  . Food insecurity - worry: Not on file  . Food insecurity - inability: Not on file  . Transportation needs - medical: Not on file  . Transportation needs - non-medical: Not on file  Occupational History  . Occupation: retired  Tobacco Use  . Smoking status: Never Smoker  . Smokeless tobacco: Never Used  Substance and Sexual Activity  . Alcohol use: No  . Drug use: No  . Sexual activity: Not Currently  Other Topics Concern  . Not on file  Social History Narrative  . Not on file    Outpatient Encounter Medications as of 11/10/2017  Medication Sig Note  . aspirin 81 MG EC tablet Take by mouth. 02/19/2015: Received from: [facility]  . BIOTIN PO Take by mouth daily.   . Cholecalciferol 1000 UNITS TBDP Take by mouth. 02/19/2015: Received from: [facility]  . Docusate Sodium 100 MG capsule Take by mouth. 02/19/2015: Received from: [facility]  . ezetimibe (ZETIA) 10 MG tablet Take 1 tablet (10 mg total) by mouth daily.   Marland Kitchen glimepiride (AMARYL) 4 MG tablet Take 1 tablet (4 mg total) by mouth daily.   Marland Kitchen levothyroxine (SYNTHROID, LEVOTHROID) 88 MCG tablet TAKE ONE TABLET BY MOUTH EVERY DAY   .  losartan (COZAAR) 100 MG tablet Take 1 tablet by mouth daily. 05/06/2016: Received from: [facility]  . ONETOUCH DELICA LANCETS 18H MISC CHECK SUGAR ONCE A DAY   . patiromer (VELTASSA) 8.4 g packet Take 8.4 g by mouth daily.   . pioglitazone (ACTOS) 30 MG tablet TAKE ONE TABLET EVERY DAY   . ranitidine (ZANTAC) 300 MG capsule Take 300 mg by mouth every evening.   . sodium bicarbonate 650 MG tablet Take 1,300 mg by mouth 2 (two) times daily.  02/19/2015: Received from: [facility]  . [DISCONTINUED] ezetimibe (ZETIA)  10 MG tablet TAKE ONE TABLET EVERY DAY   . [DISCONTINUED] Omeprazole 20 MG TBEC Take by mouth. 02/19/2015: Received from: [facility]   No facility-administered encounter medications on file as of 11/10/2017.     Allergies  Allergen Reactions  . Augmentin  [Amoxicillin-Pot Clavulanate] Anaphylaxis  . Cleocin  [Clindamycin] Anaphylaxis  . Penicillins Anaphylaxis  . Cefprozil   . Citric Acid Sodium  [Sodium Citrate]   . Codeine   . Vancomycin Hcl   . Oxycodone Rash    Review of Systems  Constitutional: Positive for malaise/fatigue. Negative for fever.  Respiratory: Positive for shortness of breath. Negative for cough and wheezing.   Cardiovascular: Positive for leg swelling. Negative for chest pain, palpitations, orthopnea and claudication.  Genitourinary: Negative for frequency.  Neurological: Negative for weakness.  Endo/Heme/Allergies: Negative for polydipsia.    Objective:  BP 122/64 (BP Location: Right Arm, Patient Position: Sitting, Cuff Size: Large)   Pulse 84   Temp 98 F (36.7 C) (Oral)   Resp 16   Wt 245 lb (111.1 kg)   BMI 44.81 kg/m   Physical Exam  Constitutional: She is oriented to person, place, and time and well-developed, well-nourished, and in no distress.  HENT:  Head: Normocephalic.  Eyes: Pupils are equal, round, and reactive to light.  Neck: Normal range of motion.  Cardiovascular: Normal rate, regular rhythm and normal heart sounds.  Pulmonary/Chest: Effort normal and breath sounds normal.  Supraclavicular lipomas/fat pads.  Abdominal: Soft.  Musculoskeletal: She exhibits no edema.  Neurological: She is alert and oriented to person, place, and time. Gait normal. GCS score is 15.  Skin: Skin is warm and dry.  Psychiatric: Mood, memory, affect and judgment normal.    Assessment and Plan :  1. Diabetic peripheral neuropathy associated with type 2 diabetes mellitus (HCC)  - Comprehensive metabolic panel - Hemoglobin A1c  2.  Impaired renal function/CKD - Comprehensive metabolic panel 3.Hypothyroid  I have done the exam and reviewed the chart and it is accurate to the best of my knowledge. Development worker, community has been used and  any errors in dictation or transcription are unintentional. Miguel Aschoff M.D. [facility]

## 2017-11-11 LAB — COMPREHENSIVE METABOLIC PANEL
ALBUMIN: 4.7 g/dL (ref 3.5–4.8)
ALT: 9 IU/L (ref 0–32)
AST: 15 IU/L (ref 0–40)
Albumin/Globulin Ratio: 1.8 (ref 1.2–2.2)
Alkaline Phosphatase: 55 IU/L (ref 39–117)
BILIRUBIN TOTAL: 0.6 mg/dL (ref 0.0–1.2)
BUN / CREAT RATIO: 23 (ref 12–28)
BUN: 42 mg/dL — AB (ref 8–27)
CO2: 21 mmol/L (ref 20–29)
CREATININE: 1.85 mg/dL — AB (ref 0.57–1.00)
Calcium: 9.7 mg/dL (ref 8.7–10.3)
Chloride: 105 mmol/L (ref 96–106)
GFR calc Af Amer: 29 mL/min/{1.73_m2} — ABNORMAL LOW (ref >59)
GFR calc non Af Amer: 26 mL/min/{1.73_m2} — ABNORMAL LOW (ref >59)
GLOBULIN, TOTAL: 2.6 g/dL (ref 1.5–4.5)
GLUCOSE: 94 mg/dL (ref 65–99)
Potassium: 5 mmol/L (ref 3.5–5.2)
SODIUM: 142 mmol/L (ref 134–144)
Total Protein: 7.3 g/dL (ref 6.0–8.5)

## 2017-11-11 LAB — HEMOGLOBIN A1C
ESTIMATED AVERAGE GLUCOSE: 123 mg/dL
HEMOGLOBIN A1C: 5.9 % — AB (ref 4.8–5.6)

## 2018-01-05 ENCOUNTER — Telehealth: Payer: Self-pay | Admitting: Family Medicine

## 2018-01-05 NOTE — Telephone Encounter (Signed)
Left message for patient to return call for appt.

## 2018-01-06 ENCOUNTER — Telehealth: Payer: Self-pay | Admitting: Family Medicine

## 2018-01-06 NOTE — Telephone Encounter (Signed)
Line busy, will try again.

## 2018-01-13 DIAGNOSIS — E113393 Type 2 diabetes mellitus with moderate nonproliferative diabetic retinopathy without macular edema, bilateral: Secondary | ICD-10-CM | POA: Diagnosis not present

## 2018-01-25 ENCOUNTER — Telehealth: Payer: Self-pay | Admitting: Family Medicine

## 2018-01-25 NOTE — Telephone Encounter (Signed)
Left patient a message to return call for an appt. °

## 2018-02-01 DIAGNOSIS — N184 Chronic kidney disease, stage 4 (severe): Secondary | ICD-10-CM | POA: Diagnosis not present

## 2018-02-01 DIAGNOSIS — R809 Proteinuria, unspecified: Secondary | ICD-10-CM | POA: Diagnosis not present

## 2018-02-01 DIAGNOSIS — D631 Anemia in chronic kidney disease: Secondary | ICD-10-CM | POA: Diagnosis not present

## 2018-02-01 DIAGNOSIS — E872 Acidosis: Secondary | ICD-10-CM | POA: Diagnosis not present

## 2018-02-01 DIAGNOSIS — N2581 Secondary hyperparathyroidism of renal origin: Secondary | ICD-10-CM | POA: Diagnosis not present

## 2018-02-05 DIAGNOSIS — Z1231 Encounter for screening mammogram for malignant neoplasm of breast: Secondary | ICD-10-CM | POA: Diagnosis not present

## 2018-03-10 ENCOUNTER — Ambulatory Visit: Payer: Self-pay | Admitting: Family Medicine

## 2018-03-10 ENCOUNTER — Ambulatory Visit: Payer: Self-pay

## 2018-03-10 ENCOUNTER — Encounter: Payer: Self-pay | Admitting: Family Medicine

## 2018-04-26 DIAGNOSIS — E872 Acidosis: Secondary | ICD-10-CM | POA: Diagnosis not present

## 2018-04-26 DIAGNOSIS — D631 Anemia in chronic kidney disease: Secondary | ICD-10-CM | POA: Diagnosis not present

## 2018-04-26 DIAGNOSIS — N2581 Secondary hyperparathyroidism of renal origin: Secondary | ICD-10-CM | POA: Diagnosis not present

## 2018-04-26 DIAGNOSIS — R809 Proteinuria, unspecified: Secondary | ICD-10-CM | POA: Diagnosis not present

## 2018-04-26 DIAGNOSIS — N184 Chronic kidney disease, stage 4 (severe): Secondary | ICD-10-CM | POA: Diagnosis not present

## 2018-04-27 ENCOUNTER — Ambulatory Visit (INDEPENDENT_AMBULATORY_CARE_PROVIDER_SITE_OTHER): Payer: Medicare HMO | Admitting: Family Medicine

## 2018-04-27 ENCOUNTER — Ambulatory Visit (INDEPENDENT_AMBULATORY_CARE_PROVIDER_SITE_OTHER): Payer: Medicare HMO

## 2018-04-27 VITALS — BP 136/78 | HR 85 | Temp 97.5°F | Ht 62.0 in | Wt 244.0 lb

## 2018-04-27 DIAGNOSIS — E7800 Pure hypercholesterolemia, unspecified: Secondary | ICD-10-CM

## 2018-04-27 DIAGNOSIS — E039 Hypothyroidism, unspecified: Secondary | ICD-10-CM

## 2018-04-27 DIAGNOSIS — Z0000 Encounter for general adult medical examination without abnormal findings: Secondary | ICD-10-CM

## 2018-04-27 DIAGNOSIS — E1142 Type 2 diabetes mellitus with diabetic polyneuropathy: Secondary | ICD-10-CM | POA: Diagnosis not present

## 2018-04-27 NOTE — Patient Instructions (Addendum)
Ms. Gina Michael , Thank you for taking time to come for your Medicare Wellness Visit. I appreciate your ongoing commitment to your health goals. Please review the following plan we discussed and let me know if I can assist you in the future.   Screening recommendations/referrals: Colonoscopy: Up to date Mammogram: Up to date Bone Density: Up to date Recommended yearly ophthalmology/optometry visit for glaucoma screening and checkup Recommended yearly dental visit for hygiene and checkup  Vaccinations: Influenza vaccine: Up to date Pneumococcal vaccine: Up to date Tdap vaccine: Pt declines today.  Shingles vaccine: Pt declines today.     Advanced directives: Please bring a copy of your POA (Power of Attorney) and/or Living Will to your next appointment.   Conditions/risks identified: Obesity- recommend eating 3 small meals a day and two healthy snacks in between. Also avoid sweets and junk food completely.   Next appointment: 2:00 PM today with Dr Rosanna Randy.    Preventive Care 80 Years and Older, Female Preventive care refers to lifestyle choices and visits with your health care provider that can promote health and wellness. What does preventive care include?  A yearly physical exam. This is also called an annual well check.  Dental exams once or twice a year.  Routine eye exams. Ask your health care provider how often you should have your eyes checked.  Personal lifestyle choices, including:  Daily care of your teeth and gums.  Regular physical activity.  Eating a healthy diet.  Avoiding tobacco and drug use.  Limiting alcohol use.  Practicing safe sex.  Taking low-dose aspirin every day.  Taking vitamin and mineral supplements as recommended by your health care provider. What happens during an annual well check? The services and screenings done by your health care provider during your annual well check will depend on your age, overall health, lifestyle risk factors, and  family history of disease. Counseling  Your health care provider may ask you questions about your:  Alcohol use.  Tobacco use.  Drug use.  Emotional well-being.  Home and relationship well-being.  Sexual activity.  Eating habits.  History of falls.  Memory and ability to understand (cognition).  Work and work Statistician.  Reproductive health. Screening  You may have the following tests or measurements:  Height, weight, and BMI.  Blood pressure.  Lipid and cholesterol levels. These may be checked every 5 years, or more frequently if you are over 62 years old.  Skin check.  Lung cancer screening. You may have this screening every year starting at age 48 if you have a 30-pack-year history of smoking and currently smoke or have quit within the past 15 years.  Fecal occult blood test (FOBT) of the stool. You may have this test every year starting at age 4.  Flexible sigmoidoscopy or colonoscopy. You may have a sigmoidoscopy every 5 years or a colonoscopy every 10 years starting at age 2.  Hepatitis C blood test.  Hepatitis B blood test.  Sexually transmitted disease (STD) testing.  Diabetes screening. This is done by checking your blood sugar (glucose) after you have not eaten for a while (fasting). You may have this done every 1-3 years.  Bone density scan. This is done to screen for osteoporosis. You may have this done starting at age 12.  Mammogram. This may be done every 1-2 years. Talk to your health care provider about how often you should have regular mammograms. Talk with your health care provider about your test results, treatment options, and if necessary,  the need for more tests. Vaccines  Your health care provider may recommend certain vaccines, such as:  Influenza vaccine. This is recommended every year.  Tetanus, diphtheria, and acellular pertussis (Tdap, Td) vaccine. You may need a Td booster every 10 years.  Zoster vaccine. You may need this  after age 57.  Pneumococcal 13-valent conjugate (PCV13) vaccine. One dose is recommended after age 26.  Pneumococcal polysaccharide (PPSV23) vaccine. One dose is recommended after age 69. Talk to your health care provider about which screenings and vaccines you need and how often you need them. This information is not intended to replace advice given to you by your health care provider. Make sure you discuss any questions you have with your health care provider. Document Released: 09/28/2015 Document Revised: 05/21/2016 Document Reviewed: 07/03/2015 Elsevier Interactive Patient Education  2017 [place] Prevention in the Home Falls can cause injuries. They can happen to people of all ages. There are many things you can do to make your home safe and to help prevent falls. What can I do on the outside of my home?  Regularly fix the edges of walkways and driveways and fix any cracks.  Remove anything that might make you trip as you walk through a door, such as a raised step or threshold.  Trim any bushes or trees on the path to your home.  Use bright outdoor lighting.  Clear any walking paths of anything that might make someone trip, such as rocks or tools.  Regularly check to see if handrails are loose or broken. Make sure that both sides of any steps have handrails.  Any raised decks and porches should have guardrails on the edges.  Have any leaves, snow, or ice cleared regularly.  Use sand or salt on walking paths during winter.  Clean up any spills in your garage right away. This includes oil or grease spills. What can I do in the bathroom?  Use night lights.  Install grab bars by the toilet and in the tub and shower. Do not use towel bars as grab bars.  Use non-skid mats or decals in the tub or shower.  If you need to sit down in the shower, use a plastic, non-slip stool.  Keep the floor dry. Clean up any water that spills on the floor as soon as it  happens.  Remove soap buildup in the tub or shower regularly.  Attach bath mats securely with double-sided non-slip rug tape.  Do not have throw rugs and other things on the floor that can make you trip. What can I do in the bedroom?  Use night lights.  Make sure that you have a light by your bed that is easy to reach.  Do not use any sheets or blankets that are too big for your bed. They should not hang down onto the floor.  Have a firm chair that has side arms. You can use this for support while you get dressed.  Do not have throw rugs and other things on the floor that can make you trip. What can I do in the kitchen?  Clean up any spills right away.  Avoid walking on wet floors.  Keep items that you use a lot in easy-to-reach places.  If you need to reach something above you, use a strong step stool that has a grab bar.  Keep electrical cords out of the way.  Do not use floor polish or wax that makes floors slippery. If you must use  wax, use non-skid floor wax.  Do not have throw rugs and other things on the floor that can make you trip. What can I do with my stairs?  Do not leave any items on the stairs.  Make sure that there are handrails on both sides of the stairs and use them. Fix handrails that are broken or loose. Make sure that handrails are as long as the stairways.  Check any carpeting to make sure that it is firmly attached to the stairs. Fix any carpet that is loose or worn.  Avoid having throw rugs at the top or bottom of the stairs. If you do have throw rugs, attach them to the floor with carpet tape.  Make sure that you have a light switch at the top of the stairs and the bottom of the stairs. If you do not have them, ask someone to add them for you. What else can I do to help prevent falls?  Wear shoes that:  Do not have high heels.  Have rubber bottoms.  Are comfortable and fit you well.  Are closed at the toe. Do not wear sandals.  If you  use a stepladder:  Make sure that it is fully opened. Do not climb a closed stepladder.  Make sure that both sides of the stepladder are locked into place.  Ask someone to hold it for you, if possible.  Clearly mark and make sure that you can see:  Any grab bars or handrails.  First and last steps.  Where the edge of each step is.  Use tools that help you move around (mobility aids) if they are needed. These include:  Canes.  Walkers.  Scooters.  Crutches.  Turn on the lights when you go into a dark area. Replace any light bulbs as soon as they burn out.  Set up your furniture so you have a clear path. Avoid moving your furniture around.  If any of your floors are uneven, fix them.  If there are any pets around you, be aware of where they are.  Review your medicines with your doctor. Some medicines can make you feel dizzy. This can increase your chance of falling. Ask your doctor what other things that you can do to help prevent falls. This information is not intended to replace advice given to you by your health care provider. Make sure you discuss any questions you have with your health care provider. Document Released: 06/28/2009 Document Revised: 02/07/2016 Document Reviewed: 10/06/2014 Elsevier Interactive Patient Education  2017 [employer].

## 2018-04-27 NOTE — Progress Notes (Signed)
Subjective:   Gina Michael is a 82 y.o. female who presents for Medicare Annual (Subsequent) preventive examination.  Review of Systems:  N/A  Cardiac Risk Factors include: advanced age (>74men, >53 women);diabetes mellitus;dyslipidemia;obesity (BMI >30kg/m2);hypertension     Objective:     Vitals: BP 136/78 (BP Location: Right Arm)   Pulse 85   Temp (!) 97.5 F (36.4 C) (Oral)   Ht 5\' 2"  (1.575 m)   Wt 244 lb (110.7 kg)   BMI 44.63 kg/m   Body mass index is 44.63 kg/m.  Advanced Directives 04/27/2018 07/09/2016  Does Patient Have a Medical Advance Directive? Yes Yes  Type of Paramedic of [place];Living will [place];Living will  Copy of [place] in Chart? No - copy requested -    Tobacco Social History   Tobacco Use  Smoking Status Never Smoker  Smokeless Tobacco Never Used     Counseling given: Not Answered   Clinical Intake:  Pre-visit preparation completed: Yes  Pain : 0-10 Pain Score: 7  Pain Type: Chronic pain Pain Location: Hip(and back) Pain Orientation: Right, Left Pain Descriptors / Indicators: Aching, Dull Pain Frequency: Constant     Nutritional Status: BMI > 30  Obese Diabetes: Yes(type 2) CBG done?: No Did pt. bring in CBG monitor from home?: No  How often do you need to have someone help you when you read instructions, pamphlets, or other written materials from your doctor or pharmacy?: 1 - Never  Interpreter Needed?: No  Information entered by :: [facility], LPN  Past Medical History:  Diagnosis Date  . Diabetes mellitus without complication ([place])    type 2  . Hyperlipidemia   . Hypertension   . Obesity    Past Surgical History:  Procedure Laterality Date  . ABDOMINAL HYSTERECTOMY  1984  . APPENDECTOMY    . BLADDER SURGERY  1985  . BREAST SURGERY     Biopsy  . CARPAL TUNNEL RELEASE  1987  . CATARACT EXTRACTION, BILATERAL     rt 09/2010; lt 01/2011  .  CHOLECYSTECTOMY    . EYE SURGERY  10/2010   Repair macular hole  . FRACTURE SURGERY Left 1969  . HERNIA REPAIR  06/06/2008   Had a surgery later to repair a infected mesh ( 10/30/09)  . JOINT REPLACEMENT Right 2004  . NEPHRECTOMY Left   . OOPHORECTOMY Left    benign   Family History  Problem Relation Age of Onset  . Kidney disease Mother   . Cancer Father   . Cancer Brother    Social History   Socioeconomic History  . Marital status: Married    Spouse name: [place]  . Number of children: 2  . Years of education: associates  . Highest education level: Associate degree: occupational, Hotel manager, or vocational program  Occupational History  . Occupation: retired  Scientific laboratory technician  . Financial resource strain: Not hard at all  . Food insecurity:    Worry: Never true    Inability: Never true  . Transportation needs:    Medical: No    Non-medical: No  Tobacco Use  . Smoking status: Never Smoker  . Smokeless tobacco: Never Used  Substance and Sexual Activity  . Alcohol use: No  . Drug use: No  . Sexual activity: Not Currently  Lifestyle  . Physical activity:    Days per week: Not on file    Minutes per session: Not on file  . Stress: Very much  Relationships  . Social connections:    Talks on phone: Not on file    Gets together: Not on file    Attends religious service: Not on file    Active member of club or organization: Not on file    Attends meetings of clubs or organizations: Not on file    Relationship status: Not on file  Other Topics Concern  . Not on file  Social History Narrative  . Not on file    Outpatient Encounter Medications as of 04/27/2018  Medication Sig  . aspirin 81 MG EC tablet Take 81 mg by mouth daily.   Marland Kitchen BIOTIN PO Take by mouth daily.  . Cholecalciferol 1000 UNITS TBDP Take 1,000 Units by mouth daily.   Mariane Baumgarten Sodium 100 MG capsule Take 100 mg by mouth as needed.   . ezetimibe (ZETIA) 10 MG tablet Take 1 tablet (10 mg total) by mouth  daily.  . furosemide (LASIX) 40 MG tablet Take 40 mg by mouth as directed. 3 mornings a week  . glimepiride (AMARYL) 4 MG tablet Take 1 tablet (4 mg total) by mouth daily.  Marland Kitchen levothyroxine (SYNTHROID, LEVOTHROID) 88 MCG tablet TAKE ONE TABLET BY MOUTH EVERY DAY  . losartan (COZAAR) 100 MG tablet Take 50 mg by mouth daily.   Glory Rosebush DELICA LANCETS 54Y MISC CHECK SUGAR ONCE A DAY  . patiromer (VELTASSA) 8.4 g packet Take 8.4 g by mouth daily.  . pioglitazone (ACTOS) 30 MG tablet TAKE ONE TABLET EVERY DAY  . sodium bicarbonate 650 MG tablet Take 1,300 mg by mouth 2 (two) times daily.   . ranitidine (ZANTAC) 300 MG capsule Take 300 mg by mouth every evening.   No facility-administered encounter medications on file as of 04/27/2018.     Activities of Daily Living In your present state of health, do you have any difficulty performing the following activities: 04/27/2018  Hearing? N  Vision? N  Difficulty concentrating or making decisions? N  Walking or climbing stairs? Y  Comment Due to SOB.   Dressing or bathing? N  Doing errands, shopping? N  Preparing Food and eating ? N  Using the Toilet? N  In the past six months, have you accidently leaked urine? N  Do you have problems with loss of bowel control? N  Managing your Medications? N  Managing your Finances? N  Housekeeping or managing your Housekeeping? N  Some recent data might be hidden    Patient Care Team: Jerrol Banana., MD as PCP - General (Family Medicine) Anthonette Legato, MD (Internal Medicine)    Assessment:   This is a routine wellness examination for Gina Michael.  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 DIET - REDUCE PORTION SIZE     Recommend eating 3 small meals a day and two healthy snacks in between.  Also avoid sweets and junk food completely.        Fall Risk Fall Risk  04/27/2018 11/10/2017 07/09/2016 09/05/2015  Falls in the past year? No No No No   Is the patient's home free of loose throw rugs in walkways, pet beds, electrical cords, etc?   yes      Grab bars in the bathroom? yes      Handrails on the stairs?   yes      Adequate  lighting?   yes  Timed Get Up and Go performed: N/A  Depression Screen PHQ 2/9 Scores 04/27/2018 11/10/2017 07/09/2016 09/05/2015  PHQ - 2 Score 1 1 0 0  PHQ- 9 Score - 4 - -     Cognitive Function:      6CIT Screen 04/27/2018  What Year? 0 points  What month? 0 points  What time? 0 points  Count back from 20 0 points  Months in reverse 0 points  Repeat phrase 0 points  Total Score 0    Immunization History  Administered Date(s) Administered  . Influenza, High Dose Seasonal PF 07/04/2015, 07/09/2016, 05/26/2017  . Pneumococcal Conjugate-13 01/30/2014  . Pneumococcal Polysaccharide-23 07/27/2000, 07/23/2005, 05/26/2017  . Td 08/04/2007  . Zoster 10/31/2008    Qualifies for Shingles Vaccine? Due for Shingles vaccine. Declined my offer to administer today. Education has been provided regarding the importance of this vaccine. Pt has been advised to call her insurance company to determine her out of pocket expense. Advised she may also receive this vaccine at her local pharmacy or Health Dept. Verbalized acceptance and understanding.  Screening Tests Health Maintenance  Topic Date Due  . TETANUS/TDAP  08/03/2017  . INFLUENZA VACCINE  04/15/2018  . HEMOGLOBIN A1C  05/10/2018  . OPHTHALMOLOGY EXAM  05/11/2018  . FOOT EXAM  05/26/2018  . DEXA SCAN  Completed  . PNA vac Low Risk Adult  Completed    Cancer Screenings: Lung: Low Dose CT Chest recommended if Age 82-80 years, 30 pack-year currently smoking OR have quit w/in 15years. Patient does not qualify. Breast:  Up to date on Mammogram? Yes   Up to date of Bone Density/Dexa?  Yes Colorectal: Up to date  Additional Screenings:  Hepatitis C Screening: Up to date     Plan:  I have personally reviewed and addressed the Medicare Annual Wellness questionnaire and have noted the following in the patient's chart:  A. Medical and social history B. Use of alcohol, tobacco or illicit drugs  C. Current medications and supplements D. Functional ability and status E.  Nutritional status F.  Physical activity G. Advance directives H. List of other physicians I.  Hospitalizations, surgeries, and ER visits in previous 12 months J.  [place] such as hearing and vision if needed, cognitive and depression L. Referrals and appointments - none  In addition, I have reviewed and discussed with patient certain preventive protocols, quality metrics, and best practice recommendations. A written personalized care plan for preventive services as well as general preventive health recommendations were provided to patient.  See attached scanned questionnaire for additional information.   Signed,  Fabio Neighbors, LPN Nurse Health Advisor   Nurse Recommendations: Pt declined the tetanus vaccine today.

## 2018-04-27 NOTE — Progress Notes (Signed)
Patient: Gina Michael, Female    DOB: 02/26/1936, 82 y.o.   MRN: [id number] Visit Date: 04/27/2018  Today's Provider: Wilhemena Durie, MD   Chief Complaint  Patient presents with  . Annual Exam   Subjective:  [name] is a 82 y.o. female who presents today for health maintenance and complete physical. She feels poorly. She reports exercising none. She reports she is sleeping poorly.  Immunization History  Administered Date(s) Administered  . Influenza, High Dose Seasonal PF 07/04/2015, 07/09/2016, 05/26/2017  . Pneumococcal Conjugate-13 01/30/2014  . Pneumococcal Polysaccharide-23 07/27/2000, 07/23/2005, 05/26/2017  . Td 08/04/2007  . Zoster 10/31/2008   06/20/13 Colonoscopy, Elliott-diverticulosis, hemorrhoids, polyps 06/30/12 BMD-Osteopenic Mammogram-[facility] Pap smear-S/P hysterectomy   Review of Systems  Constitutional: Positive for appetite change and fatigue.  HENT: Negative.   Eyes: Negative.   Respiratory: Positive for shortness of breath.   Cardiovascular: Positive for leg swelling.  Gastrointestinal: Negative.   Endocrine: Positive for polyphagia.  Genitourinary: Negative.   Musculoskeletal: Positive for arthralgias and back pain.  Skin: Negative.   Allergic/Immunologic: Negative.   Neurological: Negative.   Hematological: Negative.   Psychiatric/Behavioral: Negative.     Social History   Socioeconomic History  . Marital status: Married    Spouse name: Sterling  . Number of children: 2  . Years of education: associates  . Highest education level: Associate degree: occupational, Hotel manager, or vocational program  Occupational History  . Occupation: retired  Scientific laboratory technician  . Financial resource strain: Not hard at all  . Food insecurity:    Worry: Never true    Inability: Never true  . Transportation needs:    Medical: No    Non-medical: No  Tobacco Use  . Smoking status: Never Smoker  . Smokeless tobacco: Never Used  Substance and Sexual Activity   . Alcohol use: No  . Drug use: No  . Sexual activity: Not Currently  Lifestyle  . Physical activity:    Days per week: Not on file    Minutes per session: Not on file  . Stress: Very much  Relationships  . Social connections:    Talks on phone: Not on file    Gets together: Not on file    Attends religious service: Not on file    Active member of club or organization: Not on file    Attends meetings of clubs or organizations: Not on file    Relationship status: Not on file  . Intimate partner violence:    Fear of current or ex partner: Not on file    Emotionally abused: Not on file    Physically abused: Not on file    Forced sexual activity: Not on file  Other Topics Concern  . Not on file  Social History Narrative  . Not on file    Patient Active Problem List   Diagnosis Date Noted  . Polymyalgia rheumatica syndrome ([place]) 04/24/2016  . Diabetes ([place]) 05/09/2015  . Bell palsy 02/19/2015  . Chronic venous insufficiency 02/19/2015  . DDD (degenerative disc disease), lumbar 02/19/2015  . Diabetic neuropathy ([place]) 02/19/2015  . Urinary system disease 02/19/2015  . Edema extremities 02/19/2015  . Bloodgood disease 02/19/2015  . Acid reflux 02/19/2015  . Cephalalgia 02/19/2015  . H/O adenomatous polyp of colon 02/19/2015  . HLD (hyperlipidemia) 02/19/2015  . Heart & renal disease, hypertensive, with heart failure ([place]) 02/19/2015  . Adult hypothyroidism 02/19/2015  . Neuritis or radiculitis due to rupture of lumbar intervertebral disc 02/19/2015  .  Lumbar canal stenosis 02/19/2015  . Cancer of kidney ([place]) 02/19/2015  . Adiposity 02/19/2015  . Arthritis, degenerative 02/19/2015  . Impaired renal function 02/19/2015  . Sialoadenitis 02/19/2015  . Diabetic peripheral neuropathy associated with type 2 diabetes mellitus ([place]) 02/19/2015  . Phlebectasia 02/19/2015  . Angiitis ([place]) 02/19/2015  . Avitaminosis D 02/19/2015    Past Surgical History:  Procedure Laterality  Date  . ABDOMINAL HYSTERECTOMY  1984  . APPENDECTOMY    . BLADDER SURGERY  1985  . BREAST SURGERY     Biopsy  . CARPAL TUNNEL RELEASE  1987  . CATARACT EXTRACTION, BILATERAL     rt 09/2010; lt 01/2011  . CHOLECYSTECTOMY    . EYE SURGERY  10/2010   Repair macular hole  . FRACTURE SURGERY Left 1969  . HERNIA REPAIR  06/06/2008   Had a surgery later to repair a infected mesh ( 10/30/09)  . JOINT REPLACEMENT Right 2004  . NEPHRECTOMY Left   . OOPHORECTOMY Left    benign    Her family history includes Cancer in her brother and father; Kidney disease in her mother.     Outpatient Encounter Medications as of 04/27/2018  Medication Sig Note  . aspirin 81 MG EC tablet Take 81 mg by mouth daily.  02/19/2015: Received from: [facility]  . BIOTIN PO Take by mouth daily.   . Cholecalciferol 1000 UNITS TBDP Take 1,000 Units by mouth daily.  02/19/2015: Received from: [facility]  . Docusate Sodium 100 MG capsule Take 100 mg by mouth as needed.  02/19/2015: Received from: [facility]  . ezetimibe (ZETIA) 10 MG tablet Take 1 tablet (10 mg total) by mouth daily.   . furosemide (LASIX) 40 MG tablet Take 40 mg by mouth as directed. 3 mornings a week   . glimepiride (AMARYL) 4 MG tablet Take 1 tablet (4 mg total) by mouth daily.   Marland Kitchen levothyroxine (SYNTHROID, LEVOTHROID) 88 MCG tablet TAKE ONE TABLET BY MOUTH EVERY DAY   . losartan (COZAAR) 100 MG tablet Take 50 mg by mouth daily.  05/06/2016: Received from: [facility]  . ONETOUCH DELICA LANCETS 15V MISC CHECK SUGAR ONCE A DAY   . patiromer (VELTASSA) 8.4 g packet Take 8.4 g by mouth daily.   . pioglitazone (ACTOS) 30 MG tablet TAKE ONE TABLET EVERY DAY   . ranitidine (ZANTAC) 300 MG capsule Take 300 mg by mouth every evening.   . sodium bicarbonate 650 MG tablet Take 1,300 mg by mouth 2 (two) times daily.  02/19/2015: Received from: [facility]   No facility-administered  encounter medications on file as of 04/27/2018.     Patient Care Team: Jerrol Banana., MD as PCP - General (Family Medicine) Anthonette Legato, MD (Internal Medicine)      Objective:   Vitals:  Vitals:   04/27/18 1324  BP: 136/78  Pulse: 85  Temp: (!) 97.5 F (36.4 C)  TempSrc: Oral  Weight: 244 lb (110.7 kg)  Height: 5\' 2"  (1.575 m)    Physical Exam  Constitutional: She is oriented to person, place, and time. She appears well-developed and well-nourished.  Pleasant obese WF NAD.  HENT:  Head: Normocephalic and atraumatic.  Right Ear: External ear normal.  Left Ear: External ear normal.  Nose: Nose normal.  Mouth/Throat: Oropharynx is clear and moist.  Eyes: Pupils are equal, round, and reactive to light. Conjunctivae and EOM are normal.  Neck: Normal range of motion. Neck supple.  Cardiovascular: Normal rate, regular rhythm, normal heart sounds and intact distal pulses.  Pulmonary/Chest: Effort normal and breath sounds normal.  Abdominal: Soft. Bowel sounds are normal.  Musculoskeletal: Normal range of motion. She exhibits edema.  1+ edema.  Neurological: She is alert and oriented to person, place, and time.  Skin: Skin is warm and dry.  Psychiatric: She has a normal mood and affect. Her behavior is normal. Judgment and thought content normal.     Depression Screen PHQ 2/9 Scores 04/27/2018 11/10/2017 07/09/2016 09/05/2015  PHQ - 2 Score 1 1 0 0  PHQ- 9 Score - 4 - -      Assessment & Plan:     Routine Health Maintenance and Physical Exam  Exercise Activities and Dietary recommendations Goals    . DIET - REDUCE PORTION SIZE     Recommend eating 3 small meals a day and two healthy snacks in between. Also avoid sweets and junk food completely.        Immunization History  Administered Date(s) Administered  . Influenza, High Dose Seasonal PF 07/04/2015, 07/09/2016, 05/26/2017  . Pneumococcal Conjugate-13 01/30/2014  . Pneumococcal  Polysaccharide-23 07/27/2000, 07/23/2005, 05/26/2017  . Td 08/04/2007  . Zoster 10/31/2008    Health Maintenance  Topic Date Due  . TETANUS/TDAP  08/03/2017  . INFLUENZA VACCINE  04/15/2018  . HEMOGLOBIN A1C  05/10/2018  . OPHTHALMOLOGY EXAM  05/11/2018  . FOOT EXAM  05/26/2018  . DEXA SCAN  Completed  . PNA vac Low Risk Adult  Completed     Discussed health benefits of physical activity, and encouraged her to engage in regular exercise appropriate for her age and condition.    I have done the exam and reviewed the chart and it is accurate to the best of my knowledge. Development worker, community has been used and  any errors in dictation or transcription are unintentional. Miguel Aschoff M.D. [facility]

## 2018-04-29 DIAGNOSIS — E1142 Type 2 diabetes mellitus with diabetic polyneuropathy: Secondary | ICD-10-CM | POA: Diagnosis not present

## 2018-04-29 DIAGNOSIS — E7800 Pure hypercholesterolemia, unspecified: Secondary | ICD-10-CM | POA: Diagnosis not present

## 2018-04-29 DIAGNOSIS — E039 Hypothyroidism, unspecified: Secondary | ICD-10-CM | POA: Diagnosis not present

## 2018-04-30 LAB — LIPID PANEL WITH LDL/HDL RATIO
Cholesterol, Total: 146 mg/dL (ref 100–199)
HDL: 62 mg/dL (ref >39)
LDL Calculated: 67 mg/dL (ref 0–99)
LDl/HDL Ratio: 1.1 ratio (ref 0.0–3.2)
TRIGLYCERIDES: 84 mg/dL (ref 0–149)
VLDL Cholesterol Cal: 17 mg/dL (ref 5–40)

## 2018-04-30 LAB — HEMOGLOBIN A1C
Est. average glucose Bld gHb Est-mCnc: 166 mg/dL
HEMOGLOBIN A1C: 7.4 % — AB (ref 4.8–5.6)

## 2018-04-30 LAB — TSH: TSH: 3.98 u[IU]/mL (ref 0.450–4.500)

## 2018-05-03 ENCOUNTER — Telehealth: Payer: Self-pay

## 2018-05-03 NOTE — Telephone Encounter (Signed)
-----   Message from Jerrol Banana., MD sent at 05/03/2018  1:15 PM EDT ----- Stable--BS a little higher.

## 2018-05-03 NOTE — Telephone Encounter (Signed)
Tried calling patient, no answer. Will try again later.  

## 2018-05-06 NOTE — Telephone Encounter (Signed)
Left message to call back  

## 2018-05-07 NOTE — Telephone Encounter (Signed)
Advised patient on 05/06/18.

## 2018-05-27 ENCOUNTER — Emergency Department: Payer: Medicare HMO

## 2018-05-27 ENCOUNTER — Other Ambulatory Visit: Payer: Self-pay

## 2018-05-27 ENCOUNTER — Inpatient Hospital Stay
Admission: EM | Admit: 2018-05-27 | Discharge: 2018-05-29 | DRG: 189 | Disposition: A | Discharge status: Home Health | Payer: Medicare HMO | Attending: Internal Medicine | Admitting: Internal Medicine

## 2018-05-27 ENCOUNTER — Encounter: Payer: Self-pay | Admitting: Emergency Medicine

## 2018-05-27 DIAGNOSIS — Z23 Encounter for immunization: Secondary | ICD-10-CM | POA: Diagnosis not present

## 2018-05-27 DIAGNOSIS — N2581 Secondary hyperparathyroidism of renal origin: Secondary | ICD-10-CM | POA: Diagnosis not present

## 2018-05-27 DIAGNOSIS — Z96651 Presence of right artificial knee joint: Secondary | ICD-10-CM | POA: Diagnosis present

## 2018-05-27 DIAGNOSIS — J9811 Atelectasis: Secondary | ICD-10-CM | POA: Diagnosis present

## 2018-05-27 DIAGNOSIS — Z841 Family history of disorders of kidney and ureter: Secondary | ICD-10-CM | POA: Diagnosis not present

## 2018-05-27 DIAGNOSIS — Z7984 Long term (current) use of oral hypoglycemic drugs: Secondary | ICD-10-CM

## 2018-05-27 DIAGNOSIS — K219 Gastro-esophageal reflux disease without esophagitis: Secondary | ICD-10-CM | POA: Diagnosis present

## 2018-05-27 DIAGNOSIS — Z66 Do not resuscitate: Secondary | ICD-10-CM | POA: Diagnosis present

## 2018-05-27 DIAGNOSIS — I872 Venous insufficiency (chronic) (peripheral): Secondary | ICD-10-CM | POA: Diagnosis present

## 2018-05-27 DIAGNOSIS — R809 Proteinuria, unspecified: Secondary | ICD-10-CM | POA: Diagnosis not present

## 2018-05-27 DIAGNOSIS — R531 Weakness: Secondary | ICD-10-CM | POA: Diagnosis not present

## 2018-05-27 DIAGNOSIS — R600 Localized edema: Secondary | ICD-10-CM | POA: Diagnosis not present

## 2018-05-27 DIAGNOSIS — E669 Obesity, unspecified: Secondary | ICD-10-CM | POA: Diagnosis present

## 2018-05-27 DIAGNOSIS — Z634 Disappearance and death of family member: Secondary | ICD-10-CM | POA: Diagnosis not present

## 2018-05-27 DIAGNOSIS — Z91128 Patient's intentional underdosing of medication regimen for other reason: Secondary | ICD-10-CM | POA: Diagnosis not present

## 2018-05-27 DIAGNOSIS — J9601 Acute respiratory failure with hypoxia: Principal | ICD-10-CM | POA: Diagnosis present

## 2018-05-27 DIAGNOSIS — Z905 Acquired absence of kidney: Secondary | ICD-10-CM

## 2018-05-27 DIAGNOSIS — R0902 Hypoxemia: Secondary | ICD-10-CM | POA: Diagnosis present

## 2018-05-27 DIAGNOSIS — E039 Hypothyroidism, unspecified: Secondary | ICD-10-CM | POA: Diagnosis not present

## 2018-05-27 DIAGNOSIS — Z6841 Body Mass Index (BMI) 40.0 and over, adult: Secondary | ICD-10-CM

## 2018-05-27 DIAGNOSIS — E1122 Type 2 diabetes mellitus with diabetic chronic kidney disease: Secondary | ICD-10-CM | POA: Diagnosis present

## 2018-05-27 DIAGNOSIS — Z881 Allergy status to other antibiotic agents status: Secondary | ICD-10-CM

## 2018-05-27 DIAGNOSIS — I5033 Acute on chronic diastolic (congestive) heart failure: Secondary | ICD-10-CM | POA: Diagnosis present

## 2018-05-27 DIAGNOSIS — Z880 Allergy status to penicillin: Secondary | ICD-10-CM

## 2018-05-27 DIAGNOSIS — Z888 Allergy status to other drugs, medicaments and biological substances status: Secondary | ICD-10-CM

## 2018-05-27 DIAGNOSIS — Z885 Allergy status to narcotic agent status: Secondary | ICD-10-CM

## 2018-05-27 DIAGNOSIS — Z79890 Hormone replacement therapy (postmenopausal): Secondary | ICD-10-CM

## 2018-05-27 DIAGNOSIS — N184 Chronic kidney disease, stage 4 (severe): Secondary | ICD-10-CM | POA: Diagnosis present

## 2018-05-27 DIAGNOSIS — Z85528 Personal history of other malignant neoplasm of kidney: Secondary | ICD-10-CM

## 2018-05-27 DIAGNOSIS — R0602 Shortness of breath: Secondary | ICD-10-CM | POA: Diagnosis not present

## 2018-05-27 DIAGNOSIS — I5030 Unspecified diastolic (congestive) heart failure: Secondary | ICD-10-CM | POA: Diagnosis not present

## 2018-05-27 DIAGNOSIS — E872 Acidosis: Secondary | ICD-10-CM | POA: Diagnosis present

## 2018-05-27 DIAGNOSIS — N179 Acute kidney failure, unspecified: Secondary | ICD-10-CM | POA: Diagnosis not present

## 2018-05-27 DIAGNOSIS — J189 Pneumonia, unspecified organism: Secondary | ICD-10-CM

## 2018-05-27 DIAGNOSIS — E785 Hyperlipidemia, unspecified: Secondary | ICD-10-CM | POA: Diagnosis present

## 2018-05-27 DIAGNOSIS — I130 Hypertensive heart and chronic kidney disease with heart failure and stage 1 through stage 4 chronic kidney disease, or unspecified chronic kidney disease: Secondary | ICD-10-CM | POA: Diagnosis not present

## 2018-05-27 DIAGNOSIS — T501X6A Underdosing of loop [high-ceiling] diuretics, initial encounter: Secondary | ICD-10-CM | POA: Diagnosis present

## 2018-05-27 DIAGNOSIS — Z79899 Other long term (current) drug therapy: Secondary | ICD-10-CM

## 2018-05-27 DIAGNOSIS — Z7982 Long term (current) use of aspirin: Secondary | ICD-10-CM

## 2018-05-27 LAB — COMPREHENSIVE METABOLIC PANEL
ALBUMIN: 3.6 g/dL (ref 3.5–5.0)
ALK PHOS: 43 U/L (ref 38–126)
ALT: 10 U/L (ref 0–44)
AST: 11 U/L — ABNORMAL LOW (ref 15–41)
Anion gap: 7 (ref 5–15)
BILIRUBIN TOTAL: 1 mg/dL (ref 0.3–1.2)
BUN: 54 mg/dL — ABNORMAL HIGH (ref 8–23)
CALCIUM: 8.6 mg/dL — AB (ref 8.9–10.3)
CO2: 21 mmol/L — ABNORMAL LOW (ref 22–32)
CREATININE: 2.01 mg/dL — AB (ref 0.44–1.00)
Chloride: 108 mmol/L (ref 98–111)
GFR calc non Af Amer: 22 mL/min — ABNORMAL LOW (ref >60)
GFR, EST AFRICAN AMERICAN: 26 mL/min — AB (ref >60)
GLUCOSE: 288 mg/dL — AB (ref 70–99)
Potassium: 4.8 mmol/L (ref 3.5–5.1)
Sodium: 136 mmol/L (ref 135–145)
TOTAL PROTEIN: 6.3 g/dL — AB (ref 6.5–8.1)

## 2018-05-27 LAB — CBC WITH DIFFERENTIAL/PLATELET
BASOS ABS: 0 10*3/uL (ref 0–0.1)
BASOS PCT: 1 %
EOS ABS: 0 10*3/uL (ref 0–0.7)
EOS PCT: 0 %
HEMATOCRIT: 26.6 % — AB (ref 35.0–47.0)
Hemoglobin: 9.3 g/dL — ABNORMAL LOW (ref 12.0–16.0)
Lymphocytes Relative: 4 %
Lymphs Abs: 0.3 10*3/uL — ABNORMAL LOW (ref 1.0–3.6)
MCH: 34.3 pg — ABNORMAL HIGH (ref 26.0–34.0)
MCHC: 34.9 g/dL (ref 32.0–36.0)
MCV: 98.2 fL (ref 80.0–100.0)
MONO ABS: 0.6 10*3/uL (ref 0.2–0.9)
Monocytes Relative: 7 %
NEUTROS ABS: 7.5 10*3/uL — AB (ref 1.4–6.5)
Neutrophils Relative %: 88 %
PLATELETS: 189 10*3/uL (ref 150–440)
RBC: 2.71 MIL/uL — ABNORMAL LOW (ref 3.80–5.20)
RDW: 13 % (ref 11.5–14.5)
WBC: 8.5 10*3/uL (ref 3.6–11.0)

## 2018-05-27 LAB — URINALYSIS, COMPLETE (UACMP) WITH MICROSCOPIC
Bacteria, UA: NONE SEEN
Bilirubin Urine: NEGATIVE
GLUCOSE, UA: 150 mg/dL — AB
HGB URINE DIPSTICK: NEGATIVE
Ketones, ur: NEGATIVE mg/dL
Leukocytes, UA: NEGATIVE
Nitrite: NEGATIVE
PH: 5 (ref 5.0–8.0)
Protein, ur: NEGATIVE mg/dL
SPECIFIC GRAVITY, URINE: 1.018 (ref 1.005–1.030)
WBC, UA: NONE SEEN WBC/hpf (ref 0–5)

## 2018-05-27 LAB — TROPONIN I

## 2018-05-27 LAB — BRAIN NATRIURETIC PEPTIDE: B NATRIURETIC PEPTIDE 5: 166 pg/mL — AB (ref 0.0–100.0)

## 2018-05-27 LAB — GLUCOSE, CAPILLARY
GLUCOSE-CAPILLARY: 220 mg/dL — AB (ref 70–99)
Glucose-Capillary: 177 mg/dL — ABNORMAL HIGH (ref 70–99)

## 2018-05-27 MED ORDER — FUROSEMIDE 10 MG/ML IJ SOLN
20.0000 mg | Freq: Once | INTRAMUSCULAR | Status: AC
  Administered 2018-05-27: 20 mg via INTRAVENOUS
  Filled 2018-05-27: qty 4

## 2018-05-27 MED ORDER — ACETAMINOPHEN 325 MG PO TABS
650.0000 mg | ORAL_TABLET | Freq: Four times a day (QID) | ORAL | Status: DC | PRN
  Administered 2018-05-27: 650 mg via ORAL
  Filled 2018-05-27: qty 2

## 2018-05-27 MED ORDER — BIOTIN 10000 MCG PO TABS: 1.0000 | ORAL_TABLET | Freq: Every day | ORAL | Status: DC

## 2018-05-27 MED ORDER — HYDRALAZINE HCL 20 MG/ML IJ SOLN: 10.0000 mg | Freq: Four times a day (QID) | INTRAMUSCULAR | Status: DC | PRN

## 2018-05-27 MED ORDER — SODIUM CHLORIDE 0.9 % IV SOLN: 250.0000 mL | INTRAVENOUS | Status: DC | PRN

## 2018-05-27 MED ORDER — SODIUM CHLORIDE 0.9% FLUSH: 3.0000 mL | INTRAVENOUS | Status: DC | PRN

## 2018-05-27 MED ORDER — ONDANSETRON HCL 4 MG PO TABS: 4.0000 mg | ORAL_TABLET | Freq: Four times a day (QID) | ORAL | Status: DC | PRN

## 2018-05-27 MED ORDER — SODIUM BICARBONATE 650 MG PO TABS
1300.0000 mg | ORAL_TABLET | Freq: Two times a day (BID) | ORAL | Status: DC
  Administered 2018-05-27 – 2018-05-29 (×4): 1300 mg via ORAL
  Filled 2018-05-27 (×4): qty 2

## 2018-05-27 MED ORDER — SENNOSIDES-DOCUSATE SODIUM 8.6-50 MG PO TABS: 1.0000 | ORAL_TABLET | Freq: Every evening | ORAL | Status: DC | PRN

## 2018-05-27 MED ORDER — ACETAMINOPHEN 650 MG RE SUPP: 650.0000 mg | Freq: Four times a day (QID) | RECTAL | Status: DC | PRN

## 2018-05-27 MED ORDER — HEPARIN SODIUM (PORCINE) 5000 UNIT/ML IJ SOLN
5000.0000 [IU] | Freq: Three times a day (TID) | INTRAMUSCULAR | Status: DC
  Administered 2018-05-27 – 2018-05-29 (×5): 5000 [IU] via SUBCUTANEOUS
  Filled 2018-05-27 (×5): qty 1

## 2018-05-27 MED ORDER — INSULIN ASPART 100 UNIT/ML ~~LOC~~ SOLN
0.0000 [IU] | Freq: Three times a day (TID) | SUBCUTANEOUS | Status: DC
  Administered 2018-05-27 – 2018-05-28 (×2): 3 [IU] via SUBCUTANEOUS
  Administered 2018-05-28 (×2): 2 [IU] via SUBCUTANEOUS
  Administered 2018-05-29: 3 [IU] via SUBCUTANEOUS
  Administered 2018-05-29: 1 [IU] via SUBCUTANEOUS
  Filled 2018-05-27 (×6): qty 1

## 2018-05-27 MED ORDER — ASPIRIN EC 81 MG PO TBEC
81.0000 mg | DELAYED_RELEASE_TABLET | Freq: Every evening | ORAL | Status: DC
  Administered 2018-05-27 – 2018-05-28 (×2): 81 mg via ORAL
  Filled 2018-05-27 (×2): qty 1

## 2018-05-27 MED ORDER — FUROSEMIDE 40 MG PO TABS: 40.0000 mg | ORAL_TABLET | ORAL | Status: DC

## 2018-05-27 MED ORDER — INSULIN ASPART 100 UNIT/ML ~~LOC~~ SOLN: 0.0000 [IU] | Freq: Every day | SUBCUTANEOUS | Status: DC

## 2018-05-27 MED ORDER — SODIUM CHLORIDE 0.9% FLUSH
3.0000 mL | Freq: Two times a day (BID) | INTRAVENOUS | Status: DC
  Administered 2018-05-27 – 2018-05-28 (×3): 3 mL via INTRAVENOUS

## 2018-05-27 MED ORDER — EZETIMIBE 10 MG PO TABS
10.0000 mg | ORAL_TABLET | Freq: Every day | ORAL | Status: DC
  Administered 2018-05-28 – 2018-05-29 (×2): 10 mg via ORAL
  Filled 2018-05-27 (×2): qty 1

## 2018-05-27 MED ORDER — ONDANSETRON HCL 4 MG/2ML IJ SOLN
4.0000 mg | Freq: Four times a day (QID) | INTRAMUSCULAR | Status: DC | PRN
  Administered 2018-05-27 – 2018-05-28 (×3): 4 mg via INTRAVENOUS
  Filled 2018-05-27 (×3): qty 2

## 2018-05-27 MED ORDER — VITAMIN D3 25 MCG (1000 UNIT) PO TABS
1000.0000 [IU] | ORAL_TABLET | Freq: Every day | ORAL | Status: DC
  Administered 2018-05-28 – 2018-05-29 (×2): 1000 [IU] via ORAL
  Filled 2018-05-27 (×2): qty 1

## 2018-05-27 MED ORDER — INFLUENZA VAC SPLIT HIGH-DOSE 0.5 ML IM SUSY
0.5000 mL | PREFILLED_SYRINGE | INTRAMUSCULAR | Status: AC
  Administered 2018-05-29: 0.5 mL via INTRAMUSCULAR
  Filled 2018-05-27 (×2): qty 0.5

## 2018-05-27 MED ORDER — LEVOTHYROXINE SODIUM 88 MCG PO TABS
88.0000 ug | ORAL_TABLET | Freq: Every day | ORAL | Status: DC
  Administered 2018-05-28 – 2018-05-29 (×2): 88 ug via ORAL
  Filled 2018-05-27 (×2): qty 1

## 2018-05-27 NOTE — Progress Notes (Signed)

## 2018-05-27 NOTE — ED Notes (Signed)
Patient transported to X-ray 

## 2018-05-27 NOTE — ED Triage Notes (Signed)
Pt arrives from home via ems with complaints of weakness. Pt denies any pain or shortness of breath. EMS reports a blood sugar or 279. Pt's husband recently died.

## 2018-05-27 NOTE — Progress Notes (Signed)
Family Meeting Note  Advance Directive:yes  Today a meeting took place with the Patient.and her sister   The following clinical team members were present during this meeting:MD  The following were discussed:Patient's diagnosis: Acute hypoxic respiratory failure patient's progosis: Unable to determine and Goals for treatment: DNR  Additional follow-up to be provided: DNR order written.  Advanced directive is up-to-date.  Time spent during discussion:16 minutes  Cari Vandeberg, MD

## 2018-05-27 NOTE — Progress Notes (Signed)
[facility]  ROUNDING NOTE   Subjective:  Patient well-known to [place]. We follow her for chronic kidney disease stage IV. She has history of left radical nephrectomy for renal cell carcinoma. Her husband recently died and thereafter she has not been feeling well. When she was brought to the emergency department her oxygen saturation was 88% on room air. She missed 2 doses of Lasix this past week.   Objective:  Vital signs in last 24 hours:  Temp:  [97.9 F (36.6 C)-98.7 F (37.1 C)] 97.9 F (36.6 C) (09/12 1656) Pulse Rate:  [61-74] 61 (09/12 1656) Resp:  [13-18] 14 (09/12 1656) BP: (125-128)/(52-60) 127/52 (09/12 1656) SpO2:  [88 %-100 %] 100 % (09/12 1656) Weight:  [102.1 kg] 102.1 kg (09/12 1150)  Weight change:  Filed Weights   05/27/18 1150  Weight: 102.1 kg    Intake/Output: No intake/output data recorded.   Intake/Output this shift:  No intake/output data recorded.  Physical Exam: General: No acute distress  Head: Normocephalic, atraumatic. Moist oral mucosal membranes  Eyes: Anicteric  Neck: Supple, trachea midline  Lungs:  Bilateral rales, normal effort  Heart: S1S2 no rubs  Abdomen:  Soft, nontender, bowel sounds present  Extremities: 2+ peripheral edema.  Neurologic: Awake, alert, following commands  Skin: No lesions       Basic Metabolic Panel: Recent Labs  Lab 05/27/18 1152  NA 136  K 4.8  CL 108  CO2 21*  GLUCOSE 288*  BUN 54*  CREATININE 2.01*  CALCIUM 8.6*    Liver Function Tests: Recent Labs  Lab 05/27/18 1152  AST 11*  ALT 10  ALKPHOS 43  BILITOT 1.0  PROT 6.3*  ALBUMIN 3.6   No results for input(s): LIPASE, AMYLASE in the last 168 hours. No results for input(s): AMMONIA in the last 168 hours.  CBC: Recent Labs  Lab 05/27/18 1152  WBC 8.5  NEUTROABS 7.5*  HGB 9.3*  HCT 26.6*  MCV 98.2  PLT 189    Cardiac Enzymes: Recent Labs  Lab 05/27/18 1152  TROPONINI <0.03    BNP: Invalid input(s):  POCBNP  CBG: Recent Labs  Lab 05/27/18 1726  GLUCAP 220*    Microbiology: No results found for this or any previous visit.  Coagulation Studies: No results for input(s): LABPROT, INR in the last 72 hours.  Urinalysis: Recent Labs    05/27/18 1343  COLORURINE YELLOW*  LABSPEC 1.018  PHURINE 5.0  GLUCOSEU 150*  HGBUR NEGATIVE  BILIRUBINUR NEGATIVE  KETONESUR NEGATIVE  PROTEINUR NEGATIVE  NITRITE NEGATIVE  LEUKOCYTESUR NEGATIVE      Imaging: Dg Chest 2 View  Result Date: 05/27/2018 CLINICAL DATA:  Weakness EXAM: CHEST - 2 VIEW COMPARISON:  Chest CT 08/06/2007 FINDINGS: Cardiomegaly and vascular pedicle widening accentuated by mediastinal fat. Mild reticulation at the right base that appears linear, streaky. There is no edema, consolidation, effusion, or pneumothorax. Midthoracic disc narrowing. IMPRESSION: Mild atelectatic type opacity at the right base. Electronically Signed   By: Monte Fantasia M.D.   On: 05/27/2018 12:29     Medications:   . sodium chloride     . aspirin EC  81 mg Oral QPM  . [START ON 05/28/2018] cholecalciferol  1,000 Units Oral Daily  . [START ON 05/28/2018] ezetimibe  10 mg Oral Daily  . [START ON 05/28/2018] furosemide  40 mg Oral Q M,W,F  . heparin  5,000 Units Subcutaneous Q8H  . [START ON 05/28/2018] Influenza vac split quadrivalent PF  0.5 mL Intramuscular Tomorrow-1000  .  insulin aspart  0-5 Units Subcutaneous QHS  . insulin aspart  0-9 Units Subcutaneous TID WC  . [START ON 05/28/2018] levothyroxine  88 mcg Oral QAC breakfast  . sodium bicarbonate  1,300 mg Oral BID  . sodium chloride flush  3 mL Intravenous Q12H   sodium chloride, acetaminophen **OR** acetaminophen, hydrALAZINE, ondansetron **OR** ondansetron (ZOFRAN) IV, senna-docusate, sodium chloride flush  Assessment/ Plan:  82 y.o. female ith past medical history of diabetes mellitus diagnosed 2001, hypothyroidism, vitamin D deficiency, hyperlipidemia, osteoarthritis, left  laparoscopic radical nephrectomy for renal cell carcinoma and left oophroectomy 10/12/07, hysterectomy with right oophorectomy, bladder tack, cholecystectomy, right breast biopsy x2, right total knee replacement, subdural hematoma  1. Chronic kidney disease stage IV/proteinuria.  Current EGFR is 22 versus 26 in the office recently.  Therefore it appears that she has some element of acute renal failure.  May be cardiorenal in origin.  Agree with continuing Lasix as she missed 2 doses earlier this week and monitoring renal function closely over the course of the hospitalization.   2.  Secondary hyperparathyroidism.  We have noted this in the office.  We will continue to monitor bone mineral metabolism parameters periodically.  3.  Metabolic acidosis.  Continue sodium bicarbonate 1300 mg p.o. twice daily for now.  Follow serum bicarbonate.  4.  Lower extremity edema/shortness of breath.  Agree with obtaining 2D echocardiogram to more fully evaluate her cardiac function.  Continue Lasix as above.  5.  Thanks for consultation.  Further plan as patient progresses.   LOS: 0 Yaiza Palazzola 9/12/20195:51 PM

## 2018-05-27 NOTE — H&P (Signed)
[place] [facility] [place] NAME: Gina Michael    MR#:  [id number]  DATE OF BIRTH:  November 13, 1935  DATE OF ADMISSION:  05/27/2018  PRIMARY CARE PHYSICIAN: Jerrol Banana., MD   REQUESTING/REFERRING PHYSICIAN: Dr. Burlene Arnt  CHIEF COMPLAINT:   Weakness HISTORY OF PRESENT ILLNESS:  Gina Michael  is a 82 y.o. female with a known history of polymyalgia rheumatica, chronic kidney disease stage IV diabetes and essential hypertension who presents to the ER due to generalized weakness.  Patient has no focal neurological deficits or shortness of breath.  She denies chest pain.  Patient reports that her husband recently died and since that time she has not been feeling well.  While in the emergency room her oxygen saturation was 88% on room air.  She denies lower extremity edema, recent travel for long distance. Chest x-ray shows atelectasis right lobe She denies weight gain however does have lower extremity edema. She is taking Lasix every other day but since her husband passed away she has not been taking Lasix.   PAST MEDICAL HISTORY:   Past Medical History:  Diagnosis Date  . Diabetes mellitus without complication ([place])    type 2  . Hyperlipidemia   . Hypertension   . Obesity     PAST SURGICAL HISTORY:   Past Surgical History:  Procedure Laterality Date  . ABDOMINAL HYSTERECTOMY  1984  . APPENDECTOMY    . BLADDER SURGERY  1985  . BREAST SURGERY     Biopsy  . CARPAL TUNNEL RELEASE  1987  . CATARACT EXTRACTION, BILATERAL     rt 09/2010; lt 01/2011  . CHOLECYSTECTOMY    . EYE SURGERY  10/2010   Repair macular hole  . FRACTURE SURGERY Left 1969  . HERNIA REPAIR  06/06/2008   Had a surgery later to repair a infected mesh ( 10/30/09)  . JOINT REPLACEMENT Right 2004  . NEPHRECTOMY Left   . OOPHORECTOMY Left    benign    SOCIAL HISTORY:   Social History   Tobacco Use  . Smoking status: Never Smoker  . Smokeless tobacco: Never Used   Substance Use Topics  . Alcohol use: No    FAMILY HISTORY:   Family History  Problem Relation Age of Onset  . Kidney disease Mother   . Cancer Father   . Cancer Brother     DRUG ALLERGIES:   Allergies  Allergen Reactions  . Amoxicillin-Pot Clavulanate Anaphylaxis and Shortness Of Breath  . Clindamycin Anaphylaxis and Shortness Of Breath    Other reaction(s): Other (See Comments) Other Reaction: Rash; swelling in lips  . Codeine Shortness Of Breath  . Oxycodone Rash and Shortness Of Breath  . Penicillins Anaphylaxis and Shortness Of Breath  . Cefprozil   . Citric Acid Sodium  [Sodium Citrate]   . Vancomycin Hcl     REVIEW OF SYSTEMS:   Review of Systems  Constitutional: Positive for malaise/fatigue. Negative for chills, fever and weight loss.  HENT: Negative.  Negative for ear discharge, ear pain, hearing loss, nosebleeds and sore throat.   Eyes: Negative.  Negative for blurred vision and pain.  Respiratory: Negative.  Negative for cough, hemoptysis, shortness of breath and wheezing.   Cardiovascular: Positive for leg swelling. Negative for chest pain and palpitations.  Gastrointestinal: Negative.  Negative for abdominal pain, blood in stool, diarrhea, nausea and vomiting.  Genitourinary: Negative.  Negative for dysuria.  Musculoskeletal: Negative.  Negative for back pain.  Skin: Negative.  Neurological: Negative for dizziness, tremors, speech change, focal weakness, seizures and headaches.  Endo/Heme/Allergies: Negative.  Does not bruise/bleed easily.  Psychiatric/Behavioral: Negative.  Negative for depression, hallucinations and suicidal ideas.    MEDICATIONS AT HOME:   Prior to Admission medications   Medication Sig Start Date End Date Taking? Authorizing Provider  aspirin 81 MG EC tablet Take 81 mg by mouth every evening.  03/06/11  Yes [provider]  Biotin 10000 MCG TABS Take 1 tablet by mouth daily.    Yes [provider]   Cholecalciferol 1000 units tablet Take 1,000 Units by mouth daily.    Yes [provider]  Docusate Sodium 100 MG capsule Take 100 mg by mouth as needed.    Yes [provider]  ezetimibe (ZETIA) 10 MG tablet Take 1 tablet (10 mg total) by mouth daily. 12/10/15  Yes Jerrol Banana., MD  furosemide (LASIX) 40 MG tablet Take 40 mg by mouth every Monday, Wednesday, and Friday.  04/26/18  Yes [provider]  glimepiride (AMARYL) 4 MG tablet Take 1 tablet (4 mg total) by mouth daily. 02/24/17  Yes Jerrol Banana., MD  levothyroxine (SYNTHROID, LEVOTHROID) 88 MCG tablet TAKE ONE TABLET BY MOUTH EVERY DAY 05/19/17  Yes Jerrol Banana., MD  losartan (COZAAR) 100 MG tablet Take 100 mg by mouth daily.  04/02/16  Yes [provider]  Jonetta Speak LANCETS 62X MISC CHECK SUGAR ONCE A DAY 11/17/16  Yes Jerrol Banana., MD  pioglitazone (ACTOS) 30 MG tablet TAKE ONE TABLET EVERY DAY 06/16/17  Yes Jerrol Banana., MD  ranitidine (ZANTAC) 300 MG capsule Take 300 mg by mouth at bedtime as needed for heartburn.    Yes [provider]  sodium bicarbonate 650 MG tablet Take 1,300 mg by mouth 2 (two) times daily.    Yes [provider]      VITAL SIGNS:  Blood pressure 125/60, pulse 67, temperature 98.7 F (37.1 C), temperature source Oral, resp. rate 13, height 5\' 3"  (1.6 m), weight 102.1 kg, SpO2 99 %.  PHYSICAL EXAMINATION:   Physical Exam  Constitutional: She is oriented to person, place, and time. No distress.  HENT:  Head: Normocephalic.  Eyes: No scleral icterus.  Neck: Normal range of motion. Neck supple. No JVD present. No tracheal deviation present.  Cardiovascular: Normal rate, regular rhythm and normal heart sounds. Exam reveals no gallop and no friction rub.  No murmur heard. Pulmonary/Chest: Effort normal and breath sounds normal. No respiratory distress. She has no wheezes. She has no rales. She exhibits no  tenderness.  Abdominal: Soft. Bowel sounds are normal. She exhibits no distension and no mass. There is no tenderness. There is no rebound and no guarding.  Musculoskeletal: Normal range of motion. She exhibits edema.  Neurological: She is alert and oriented to person, place, and time.  Skin: Skin is warm. No rash noted. No erythema.  Psychiatric: Judgment normal.      LABORATORY PANEL:   CBC Recent Labs  Lab 05/27/18 1152  WBC 8.5  HGB 9.3*  HCT 26.6*  PLT 189   ------------------------------------------------------------------------------------------------------------------  Chemistries  Recent Labs  Lab 05/27/18 1152  NA 136  K 4.8  CL 108  CO2 21*  GLUCOSE 288*  BUN 54*  CREATININE 2.01*  CALCIUM 8.6*  AST 11*  ALT 10  ALKPHOS 43  BILITOT 1.0   ------------------------------------------------------------------------------------------------------------------  Cardiac Enzymes Recent Labs  Lab 05/27/18 1152  TROPONINI <0.03   ------------------------------------------------------------------------------------------------------------------  RADIOLOGY:  Dg Chest 2 View  Result Date: 05/27/2018 CLINICAL DATA:  Weakness EXAM: CHEST - 2 VIEW COMPARISON:  Chest CT 08/06/2007 FINDINGS: Cardiomegaly and vascular pedicle widening accentuated by mediastinal fat. Mild reticulation at the right base that appears linear, streaky. There is no edema, consolidation, effusion, or pneumothorax. Midthoracic disc narrowing. IMPRESSION: Mild atelectatic type opacity at the right base. Electronically Signed   By: Monte Fantasia M.D.   On: 05/27/2018 12:29    EKG:  Sinus rhythm with PAC right bundle branch block  IMPRESSION AND PLAN:   82 year old female with history of chronic kidney disease stage IV, diabetes and PMR who presents to the ER due to generalized weakness and found to have hypoxia.  1.  Acute hypoxic respiratory failure: Chest x-ray shows atelectasis however  patient reports lower extremity edema.  She is on Lasix which she has skipped over the past week.  She was given 1 dose of 20 IV of Lasix while in the emergency room. I will order echocardiogram to evaluate cardiac function. Due to increased creatinine I will hold Lasix for now until further follow-up after Lasix and nephrology consultation. Incentive spirometer ordered Repeat chest x-ray in a.m. To evaluate for underlying pneumonia not clearly seen today and x-ray. Wean oxygen to room air  2.  Acute on chronic kidney disease stage IV: Nephrology consultation requested Hold losartan for now  3.  Diabetes: Continue siding scale with ADA diet Diabetes nurse consultation requested Hold oral medications for now If she does indeed have CHF then probably patient should not be on p.o. glitazone at discharge.  4.  Hypothyroid is not: Continue Synthroid  5.  Essential hypertension: Hold losartan for now due to acute kidney injury PRN hydralazine ordered 6.  Hyperlipidemia: Continue Zetia   All the records are reviewed and case discussed with ED provider. Management plans discussed with the patient and she is in agreement  CODE STATUS: dnr TOTAL TIME TAKING CARE OF THIS PATIENT: 50 minutes.    Kadelyn Dimascio M.D on 05/27/2018 at 3:17 PM  Between 7am to 6pm - Pager - [phone]  After 6pm go to [email or web address] - password [email or web address] [facility]  Office  [phone]  CC: Primary care physician; Jerrol Banana., MD

## 2018-05-27 NOTE — ED Provider Notes (Signed)
[facility] Emergency Department Provider Note  ____________________________________________   I have reviewed the triage vital signs and the nursing notes. Where available I have reviewed prior notes and, if possible and indicated, outside hospital notes.    HISTORY  Chief Complaint Weakness    HPI Gina Michael is a 82 y.o. female who presents today complaining of feeling rundown.  She does not have a specific complaint she does states that she does not feel right, she states she feels generally weak.  She denies any focal numbness or weakness or fall.  Patient states that this is been going on for a day or 2.  She did just buried her husband of 59 years 2 days ago she states she really has not been taking her medications for the last several days.  Patient does state that she has some degree of orthopnea and chronic exertional dyspnea.  She thinks her orthopnea might be slightly worse now that I mention it.  She has chronic lower extremity edema which is also slightly worse.  She has a history of only one kidney and she has not had an echo of her heart done in years she states.  Patient states that she is just not been taking care of herself and she feels bad generally.  She denies any fever or chills, she denies any vomiting but she does some nausea she denies any chest pain she denies any dysuria urinary frequency or diarrhea.  Is been feeling bad since the funeral in a couple days before possibly as well.  Nothing makes it better nothing makes worse no other alleviating or aggravating factors no other complaints    Past Medical History:  Diagnosis Date  . Diabetes mellitus without complication ([place])    type 2  . Hyperlipidemia   . Hypertension   . Obesity     Patient Active Problem List   Diagnosis Date Noted  . Polymyalgia rheumatica syndrome ([place]) 04/24/2016  . Diabetes ([place]) 05/09/2015  . Bell palsy 02/19/2015  . Chronic venous insufficiency  02/19/2015  . DDD (degenerative disc disease), lumbar 02/19/2015  . Diabetic neuropathy ([place]) 02/19/2015  . Urinary system disease 02/19/2015  . Edema extremities 02/19/2015  . Bloodgood disease 02/19/2015  . Acid reflux 02/19/2015  . Cephalalgia 02/19/2015  . H/O adenomatous polyp of colon 02/19/2015  . HLD (hyperlipidemia) 02/19/2015  . Heart & renal disease, hypertensive, with heart failure ([place]) 02/19/2015  . Adult hypothyroidism 02/19/2015  . Neuritis or radiculitis due to rupture of lumbar intervertebral disc 02/19/2015  . Lumbar canal stenosis 02/19/2015  . Cancer of kidney ([place]) 02/19/2015  . Adiposity 02/19/2015  . Arthritis, degenerative 02/19/2015  . Impaired renal function 02/19/2015  . Sialoadenitis 02/19/2015  . Diabetic peripheral neuropathy associated with type 2 diabetes mellitus ([place]) 02/19/2015  . Phlebectasia 02/19/2015  . Angiitis ([place]) 02/19/2015  . Avitaminosis D 02/19/2015    Past Surgical History:  Procedure Laterality Date  . ABDOMINAL HYSTERECTOMY  1984  . APPENDECTOMY    . BLADDER SURGERY  1985  . BREAST SURGERY     Biopsy  . CARPAL TUNNEL RELEASE  1987  . CATARACT EXTRACTION, BILATERAL     rt 09/2010; lt 01/2011  . CHOLECYSTECTOMY    . EYE SURGERY  10/2010   Repair macular hole  . FRACTURE SURGERY Left 1969  . HERNIA REPAIR  06/06/2008   Had a surgery later to repair a infected mesh ( 10/30/09)  . JOINT REPLACEMENT Right 2004  .  NEPHRECTOMY Left   . OOPHORECTOMY Left    benign    Prior to Admission medications   Medication Sig Start Date End Date Taking? Authorizing Provider  aspirin 81 MG EC tablet Take 81 mg by mouth every evening.  03/06/11  Yes [provider]  Biotin 10000 MCG TABS Take 1 tablet by mouth daily.    Yes [provider]  Cholecalciferol 1000 units tablet Take 1,000 Units by mouth daily.    Yes [provider]  Docusate Sodium 100 MG capsule Take 100 mg by mouth as needed.    Yes [provider]  ezetimibe (ZETIA) 10 MG tablet Take 1 tablet (10 mg total) by mouth daily. 12/10/15  Yes Jerrol Banana., MD  furosemide (LASIX) 40 MG tablet Take 40 mg by mouth every Monday, Wednesday, and Friday.  04/26/18  Yes [provider]  glimepiride (AMARYL) 4 MG tablet Take 1 tablet (4 mg total) by mouth daily. 02/24/17  Yes Jerrol Banana., MD  levothyroxine (SYNTHROID, LEVOTHROID) 88 MCG tablet TAKE ONE TABLET BY MOUTH EVERY DAY 05/19/17  Yes Jerrol Banana., MD  losartan (COZAAR) 100 MG tablet Take 100 mg by mouth daily.  04/02/16  Yes [provider]  Jonetta Speak LANCETS 75Z MISC CHECK SUGAR ONCE A DAY 11/17/16  Yes Jerrol Banana., MD  pioglitazone (ACTOS) 30 MG tablet TAKE ONE TABLET EVERY DAY 06/16/17  Yes Jerrol Banana., MD  ranitidine (ZANTAC) 300 MG capsule Take 300 mg by mouth at bedtime as needed for heartburn.    Yes [provider]  sodium bicarbonate 650 MG tablet Take 1,300 mg by mouth 2 (two) times daily.    Yes [provider]    Allergies Amoxicillin-pot clavulanate; Clindamycin; Codeine; Oxycodone; Penicillins; Cefprozil; Citric acid sodium  [sodium citrate]; and Vancomycin hcl  Family History  Problem Relation Age of Onset  . Kidney disease Mother   . Cancer Father   . Cancer Brother     Social History Social History   Tobacco Use  . Smoking status: Never Smoker  . Smokeless tobacco: Never Used  Substance Use Topics  . Alcohol use: No  . Drug use: No    Review of Systems Constitutional: No fever/chills Eyes: No visual changes. ENT: No sore throat. No stiff neck no neck pain Cardiovascular: Denies chest pain. Respiratory: See HPI shortness of breath. Gastrointestinal:   no vomiting.  No diarrhea.  No constipation. Genitourinary: Negative for dysuria. Musculoskeletal: See HPI lower extremity swelling Skin: Negative for rash. Neurological: Negative for severe headaches, focal  weakness or numbness.   ____________________________________________   PHYSICAL EXAM:  VITAL SIGNS: ED Triage Vitals  Enc Vitals Group     BP 05/27/18 1149 (!) 128/59     Pulse Rate 05/27/18 1151 74     Resp 05/27/18 1149 18     Temp 05/27/18 1149 98.7 F (37.1 C)     Temp Source 05/27/18 1149 Oral     SpO2 05/27/18 1149 (!) 88 %     Weight 05/27/18 1150 225 lb (102.1 kg)     Height 05/27/18 1150 5\' 3"  (1.6 m)     Head Circumference --      Peak Flow --      Pain Score 05/27/18 1150 0     Pain Loc --      Pain Edu? --      Excl. in [place]? --     Constitutional: Alert  and oriented. Well appearing and in no acute distress. Eyes: Conjunctivae are normal Head: Atraumatic HEENT: No congestion/rhinnorhea. Mucous membranes are moist.  Oropharynx non-erythematous Neck:   Nontender with no meningismus, no masses, no stridor Cardiovascular: Normal rate, regular rhythm. Grossly normal heart sounds.  Good peripheral circulation. Respiratory: Normal respiratory effort.  No retractions. Lungs CTAB. Abdominal: Soft and nontender. No distention. No guarding no rebound Back:  There is no focal tenderness or step off.  there is no midline tenderness there are no lesions noted. there is no CVA tenderness Musculoskeletal: No lower extremity tenderness, no upper extremity tenderness. No joint effusions, no DVT signs strong distal pulses mild bilateral pitting edema Neurologic:  Normal speech and language. No gross focal neurologic deficits are appreciated.  Skin:  Skin is warm, dry and intact. No rash noted. Psychiatric: Mood and affect are normal. Speech and behavior are normal.  ____________________________________________   LABS (all labs ordered are listed, but only abnormal results are displayed)  Labs Reviewed  COMPREHENSIVE METABOLIC PANEL - Abnormal; Notable for the following components:      Result Value   CO2 21 (*)    Glucose, Bld 288 (*)    BUN 54 (*)    Creatinine, Ser 2.01  (*)    Calcium 8.6 (*)    Total Protein 6.3 (*)    AST 11 (*)    GFR calc non Af Amer 22 (*)    GFR calc Af Amer 26 (*)    All other components within normal limits  CBC WITH DIFFERENTIAL/PLATELET - Abnormal; Notable for the following components:   RBC 2.71 (*)    Hemoglobin 9.3 (*)    HCT 26.6 (*)    MCH 34.3 (*)    Neutro Abs 7.5 (*)    Lymphs Abs 0.3 (*)    All other components within normal limits  BRAIN NATRIURETIC PEPTIDE - Abnormal; Notable for the following components:   B Natriuretic Peptide 166.0 (*)    All other components within normal limits  URINALYSIS, COMPLETE (UACMP) WITH MICROSCOPIC - Abnormal; Notable for the following components:   Color, Urine YELLOW (*)    APPearance CLEAR (*)    Glucose, UA 150 (*)    All other components within normal limits  TROPONIN I    Pertinent labs  results that were available during my care of the patient were reviewed by me and considered in my medical decision making (see chart for details). ____________________________________________  EKG  I personally interpreted any EKGs ordered by me or triage Sinus rhythm rate 70 bpm right bundle branch block, no acute ST elevation or depression normal axis ____________________________________________  RADIOLOGY  Pertinent labs & imaging results that were available during my care of the patient were reviewed by me and considered in my medical decision making (see chart for details). If possible, patient and/or family made aware of any abnormal findings.  Dg Chest 2 View  Result Date: 05/27/2018 CLINICAL DATA:  Weakness EXAM: CHEST - 2 VIEW COMPARISON:  Chest CT 08/06/2007 FINDINGS: Cardiomegaly and vascular pedicle widening accentuated by mediastinal fat. Mild reticulation at the right base that appears linear, streaky. There is no edema, consolidation, effusion, or pneumothorax. Midthoracic disc narrowing. IMPRESSION: Mild atelectatic type opacity at the right base. Electronically  Signed   By: Monte Fantasia M.D.   On: 05/27/2018 12:29   ____________________________________________    PROCEDURES  Procedure(s) performed: None  Procedures  Critical Care performed: None  ____________________________________________   INITIAL IMPRESSION / ASSESSMENT  AND PLAN / ED COURSE  Pertinent labs & imaging results that were available during my care of the patient were reviewed by me and considered in my medical decision making (see chart for details).  Patient here with generalized weakness and low sats.  She has not been taking her Lasix for a week.  Her BNP is mildly elevated and she has a little bit of edema on her legs.  Unclear of etiology for her shortness of breath and hypoxia however, patient certainly is not a candidate for a CT scan given the fact that she has one kidney and poor kidney function.  We will see if diuresing her helps her feel better, the rest of her work-up thus far is reassuring, if sats do not improve diuresis or echo is noncontributory to her care she may require VQ scan as an inpatient.  Obviously we cannot discharge her with a sat of 88, she does bump up on oxygen but is not on home oxygen.   ____________________________________________   FINAL CLINICAL IMPRESSION(S) / ED DIAGNOSES  Final diagnoses:  Weakness  Hypoxemia      This chart was dictated using voice recognition software.  Despite best efforts to proofread,  errors can occur which can change meaning.      Schuyler Amor, MD 05/27/18 [phone]

## 2018-05-28 ENCOUNTER — Inpatient Hospital Stay (HOSPITAL_COMMUNITY)
Admit: 2018-05-28 | Discharge: 2018-05-28 | Disposition: A | Discharge status: Home / Self Care | Payer: Medicare HMO | Attending: Internal Medicine | Admitting: Internal Medicine

## 2018-05-28 ENCOUNTER — Inpatient Hospital Stay: Payer: Medicare HMO

## 2018-05-28 DIAGNOSIS — I5030 Unspecified diastolic (congestive) heart failure: Secondary | ICD-10-CM

## 2018-05-28 LAB — GLUCOSE, CAPILLARY
GLUCOSE-CAPILLARY: 197 mg/dL — AB (ref 70–99)
Glucose-Capillary: 186 mg/dL — ABNORMAL HIGH (ref 70–99)
Glucose-Capillary: 250 mg/dL — ABNORMAL HIGH (ref 70–99)
Glucose-Capillary: 164 mg/dL — ABNORMAL HIGH (ref 70–99)

## 2018-05-28 LAB — BASIC METABOLIC PANEL
Anion gap: 8 (ref 5–15)
BUN: 59 mg/dL — AB (ref 8–23)
CALCIUM: 8.5 mg/dL — AB (ref 8.9–10.3)
CO2: 21 mmol/L — AB (ref 22–32)
CREATININE: 2.35 mg/dL — AB (ref 0.44–1.00)
Chloride: 106 mmol/L (ref 98–111)
GFR calc Af Amer: 22 mL/min — ABNORMAL LOW (ref >60)
GFR calc non Af Amer: 19 mL/min — ABNORMAL LOW (ref >60)
GLUCOSE: 203 mg/dL — AB (ref 70–99)
Potassium: 4.7 mmol/L (ref 3.5–5.1)
Sodium: 135 mmol/L (ref 135–145)

## 2018-05-28 LAB — ECHOCARDIOGRAM COMPLETE
HEIGHTINCHES: 63 in
WEIGHTICAEL: 3809.55 [oz_av]

## 2018-05-28 NOTE — Evaluation (Signed)
Physical Therapy Evaluation Patient Details Name: Gina Michael MRN: [id number] DOB: 1936-04-11 Today's Date: 05/28/2018   History of Present Illness  Pt admitted for hypoxia with complaints of weakness. HIstory of polymyalgia rheumatica, CKD stage 4, DM, HTN, and obesity. Of note, husband recently died and now she lives alone.  Clinical Impression  Pt is a pleasant 82 year old female who was admitted for hypoxia. Pt performs transfers with supervision and ambulation with cga and RW. Pt demonstrates deficits with strength/mobility/endurance. Pt currently does not use RW for mobility, however due to decreased endurance, recommend use of RW for all mobility at this time. Would benefit from skilled PT to address above deficits and promote optimal return to PLOF. Pt is currently not at baseline level. Recommend transition to [place] upon discharge from acute hospitalization.  SaO2 on room air at rest = 93% SaO2 on room air while ambulating = 85% SaO2 on 2 liters of O2 while ambulating = 97%        Follow Up Recommendations Home health PT    Equipment Recommendations  None recommended by PT    Recommendations for Other Services       Precautions / Restrictions Precautions Precautions: Fall Restrictions Weight Bearing Restrictions: No      Mobility  Bed Mobility               General bed mobility comments: not tested as pt in recliner upon arrival.  Transfers Overall transfer level: Needs assistance Equipment used: Rolling walker (2 wheeled) Transfers: Sit to/from Stand Sit to Stand: Supervision         General transfer comment: safe technique with upright posture noted. Use of RW noted.  Ambulation/Gait Ambulation/Gait assistance: Min guard Gait Distance (Feet): 80 Feet Assistive device: Rolling walker (2 wheeled) Gait Pattern/deviations: Step-through pattern     General Gait Details: ambulated 2 laps in room due to fatigue. Safe technique performed with RW and  pt without LOB. First bout on RA with sats decreasing to 85%. 2nd bout performed on 2L of O2 with ability to maintain sats at 97%.   Stairs            Wheelchair Mobility    Modified Rankin (Stroke Patients Only)       Balance Overall balance assessment: Modified Independent                                           Pertinent Vitals/Pain Pain Assessment: No/denies pain    Home Living Family/patient expects to be discharged to:: Private residence Living Arrangements: Alone Available Help at Discharge: Available PRN/intermittently Type of Home: House Home Access: Stairs to enter Entrance Stairs-Rails: Can reach both Entrance Stairs-Number of Steps: 7 Home Layout: Multi-level;Able to live on main level with bedroom/bathroom Home Equipment: Walker - 2 wheels      Prior Function Level of Independence: Independent         Comments: reports she was active and has no falls in past year. Has RW in home, however was not currently using it     Hand Dominance        Extremity/Trunk Assessment   Upper Extremity Assessment Upper Extremity Assessment: Overall WFL for tasks assessed    Lower Extremity Assessment Lower Extremity Assessment: Generalized weakness(B LE grossly 4/5)       Communication   Communication: No difficulties  Cognition Arousal/Alertness: (sleepy) Behavior  During Therapy: WFL for tasks assessed/performed Overall Cognitive Status: Within Functional Limits for tasks assessed                                        General Comments      Exercises Other Exercises Other Exercises: Pt able to perform seated marching and SLRs x 10 reps with supervision. Safe technique performed   Assessment/Plan    PT Assessment Patient needs continued PT services  PT Problem List Decreased strength;Decreased mobility;Cardiopulmonary status limiting activity;Obesity       PT Treatment Interventions Gait training;Stair  training;Balance training;Therapeutic exercise    PT Goals (Current goals can be found in the Care Plan section)  Acute Rehab PT Goals Patient Stated Goal: to get stronger PT Goal Formulation: With patient Time For Goal Achievement: 06/11/18 Potential to Achieve Goals: Good    Frequency Min 2X/week   Barriers to discharge Decreased caregiver support      Co-evaluation               AM-PAC PT "6 Clicks" Daily Activity  Outcome Measure Difficulty turning over in bed (including adjusting bedclothes, sheets and blankets)?: None Difficulty moving from lying on back to sitting on the side of the bed? : None Difficulty sitting down on and standing up from a chair with arms (e.g., wheelchair, bedside commode, etc,.)?: None Help needed moving to and from a bed to chair (including a wheelchair)?: A Little Help needed walking in hospital room?: A Little Help needed climbing 3-5 steps with a railing? : A Little 6 Click Score: 21    End of Session Equipment Utilized During Treatment: Gait belt;Oxygen Activity Tolerance: Patient tolerated treatment well Patient left: in chair(no alarm in room) Nurse Communication: Mobility status PT Visit Diagnosis: Muscle weakness (generalized) (M62.81);Difficulty in walking, not elsewhere classified (R26.2)    Time: 1015-1040 PT Time Calculation (min) (ACUTE ONLY): 25 min   Charges:   PT Evaluation $PT Eval Low Complexity: 1 Low PT Treatments $Therapeutic Exercise: 8-22 mins        Greggory Stallion, PT, DPT [phone]   Stewart Pimenta 05/28/2018, 2:37 PM

## 2018-05-28 NOTE — Progress Notes (Signed)
*  PRELIMINARY RESULTS* Echocardiogram 2D Echocardiogram has been performed.  Gina Michael 05/28/2018, 8:36 AM

## 2018-05-28 NOTE — Plan of Care (Signed)

## 2018-05-28 NOTE — Progress Notes (Signed)
[place] [facility] [place] NAME: Gina Michael    MR#:  [id number]  DATE OF BIRTH:  Jun 22, 1936  SUBJECTIVE:  CHIEF COMPLAINT: Patient is resting comfortably out of bed to chair.  Denies any nausea or vomiting but reporting weakness  REVIEW OF SYSTEMS:  CONSTITUTIONAL: No fever, fatigue or weakness.  EYES: No blurred or double vision.  EARS, NOSE, AND THROAT: No tinnitus or ear pain.  RESPIRATORY: No cough, shortness of breath, wheezing or hemoptysis.  CARDIOVASCULAR: No chest pain, orthopnea, edema.  GASTROINTESTINAL: No nausea, vomiting, diarrhea or abdominal pain.  GENITOURINARY: No dysuria, hematuria.  ENDOCRINE: No polyuria, nocturia,  HEMATOLOGY: No anemia, easy bruising or bleeding SKIN: No rash or lesion. MUSCULOSKELETAL: No joint pain or arthritis.   NEUROLOGIC: No tingling, numbness, weakness.  PSYCHIATRY: No anxiety or depression.   DRUG ALLERGIES:   Allergies  Allergen Reactions  . Amoxicillin-Pot Clavulanate Anaphylaxis and Shortness Of Breath  . Clindamycin Anaphylaxis and Shortness Of Breath    Other reaction(s): Other (See Comments) Other Reaction: Rash; swelling in lips  . Codeine Shortness Of Breath  . Oxycodone Rash and Shortness Of Breath  . Penicillins Anaphylaxis and Shortness Of Breath  . Cefprozil   . Citric Acid Sodium  [Sodium Citrate]   . Vancomycin Hcl     VITALS:  Blood pressure (!) 127/58, pulse 68, temperature 98 F (36.7 C), resp. rate 16, height 5\' 3"  (1.6 m), weight 108 kg, SpO2 98 %.  PHYSICAL EXAMINATION:  GENERAL:  82 y.o.-year-old patient lying in the bed with no acute distress.  EYES: Pupils equal, round, reactive to light and accommodation. No scleral icterus. Extraocular muscles intact.  HEENT: Head atraumatic, normocephalic. Oropharynx and nasopharynx clear.  NECK:  Supple, no jugular venous distention. No thyroid enlargement, no tenderness.  LUNGS: Normal breath sounds bilaterally, no  wheezing, rales,rhonchi or crepitation. No use of accessory muscles of respiration.  CARDIOVASCULAR: S1, S2 normal. No murmurs, rubs, or gallops.  ABDOMEN: Soft, nontender, nondistended. Bowel sounds present. No organomegaly or mass.  EXTREMITIES: Trace pedal edema, no cyanosis, or clubbing.  NEUROLOGIC: Cranial nerves II through XII are intact. Muscle strength 5/5 in all extremities. Sensation intact. Gait not checked.  PSYCHIATRIC: The patient is alert and oriented x 3.  SKIN: No obvious rash, lesion, or ulcer.    LABORATORY PANEL:   CBC Recent Labs  Lab 05/27/18 1152  WBC 8.5  HGB 9.3*  HCT 26.6*  PLT 189   ------------------------------------------------------------------------------------------------------------------  Chemistries  Recent Labs  Lab 05/27/18 1152 05/28/18 0606  NA 136 135  K 4.8 4.7  CL 108 106  CO2 21* 21*  GLUCOSE 288* 203*  BUN 54* 59*  CREATININE 2.01* 2.35*  CALCIUM 8.6* 8.5*  AST 11*  --   ALT 10  --   ALKPHOS 43  --   BILITOT 1.0  --    ------------------------------------------------------------------------------------------------------------------  Cardiac Enzymes Recent Labs  Lab 05/27/18 1152  TROPONINI <0.03   ------------------------------------------------------------------------------------------------------------------  RADIOLOGY:  Dg Chest 1 View  Result Date: 05/28/2018 CLINICAL DATA:  Pneumonia. EXAM: CHEST  1 VIEW COMPARISON:  Frontal and lateral views yesterday. FINDINGS: Unchanged cardiomegaly and mediastinal contours. Decreased vascular congestion from prior. Improved bibasilar atelectasis. No new focal airspace disease. No pleural fluid or pneumothorax. Unchanged osseous structures. IMPRESSION: Improved right basilar atelectasis. No focal airspace disease to suggest pneumonia. Electronically Signed   By: Keith Rake M.D.   On: 05/28/2018 05:28   Dg Chest 2 View  Result Date: 05/27/2018 CLINICAL DATA:  Weakness  EXAM: CHEST - 2 VIEW COMPARISON:  Chest CT 08/06/2007 FINDINGS: Cardiomegaly and vascular pedicle widening accentuated by mediastinal fat. Mild reticulation at the right base that appears linear, streaky. There is no edema, consolidation, effusion, or pneumothorax. Midthoracic disc narrowing. IMPRESSION: Mild atelectatic type opacity at the right base. Electronically Signed   By: Monte Fantasia M.D.   On: 05/27/2018 12:29    EKG:   Orders placed or performed during the hospital encounter of 05/27/18  . EKG 12-Lead  . EKG 12-Lead  . EKG 12-Lead  . EKG 12-Lead  . ED EKG  . ED EKG    ASSESSMENT AND PLAN:   82 year old female with history of chronic kidney disease stage IV, diabetes and PMR who presents to the ER due to generalized weakness and found to have hypoxia.  1.  Acute hypoxic respiratory failure: Chest x-ray shows atelectasis however patient reports lower extremity edema.   echocardiogram to evaluate cardiac function. Due to increased creatinine I will hold Lasix for now until further follow-up after Lasix and nephrology consultation. Incentive spirometer ordered Repeat chest x-ray no pneumonia Wean oxygen to room air  2.  Acute on chronic kidney disease stage IV: With metabolic acidosis Nephrology consultation requested seen by Dr. Zollie Scale Hold Lasix, losartan for now; patient has received 1 dose of IV Lasix in the emergency department Creatinine at 2.35 Encouraged patient to take p.o. Fluids Check a.m. Labs Sodium bicarb 1300 mg p.o. twice daily  3.  Diabetes: Continue siding scale with ADA diet Diabetes nurse consultation requested Hold oral medications for now If she does indeed have CHF then probably patient should not be on p.o. glitazone at discharge.  4.  Hypothyroid is not: Continue Synthroid  5.  Essential hypertension: Hold losartan for now due to acute kidney injury PRN hydralazine ordered  6.  Hyperlipidemia: Continue Zetia     All the records  are reviewed and case discussed with Care Management/Social Workerr. Management plans discussed with the patient, family and they are in agreement.  CODE STATUS: dnr   TOTAL TIME TAKING CARE OF THIS PATIENT: 36  minutes.   POSSIBLE D/C IN 2  DAYS, DEPENDING ON CLINICAL CONDITION.  Note: This dictation was prepared with Dragon dictation along with smaller phrase technology. Any transcriptional errors that result from this process are unintentional.   Nicholes Mango M.D on 05/28/2018 at 3:58 PM  Between 7am to 6pm - Pager - [phone] After 6pm go to [email or web address] - password [email or web address] [facility]  [place] [facility]  Office  [phone]  CC: Primary care physician; Jerrol Banana., MD

## 2018-05-28 NOTE — Progress Notes (Signed)
[facility]  ROUNDING NOTE   Subjective:  Renal function worse. BUN up to 59 with a creatinine of 2.35. States that she is less short of breath this a.m.   Objective:  Vital signs in last 24 hours:  Temp:  [97.9 F (36.6 C)-98.7 F (37.1 C)] 98 F (36.7 C) (09/13 0833) Pulse Rate:  [59-74] 68 (09/13 0833) Resp:  [13-18] 16 (09/13 0833) BP: (120-138)/(47-60) 127/58 (09/13 0833) SpO2:  [88 %-100 %] 98 % (09/13 0833) Weight:  [102.1 kg-110 kg] 108 kg (09/13 0359)  Weight change:  Filed Weights   05/27/18 1150 05/27/18 1656 05/28/18 0359  Weight: 102.1 kg 110 kg 108 kg    Intake/Output: No intake/output data recorded.   Intake/Output this shift:  No intake/output data recorded.  Physical Exam: General: No acute distress  Head: Normocephalic, atraumatic. Moist oral mucosal membranes  Eyes: Anicteric  Neck: Supple, trachea midline  Lungs:  Minimal rales, normal effort  Heart: S1S2 no rubs  Abdomen:  Soft, nontender, bowel sounds present  Extremities: trace peripheral edema.  Neurologic: Awake, alert, following commands  Skin: No lesions       Basic Metabolic Panel: Recent Labs  Lab 05/27/18 1152 05/28/18 0606  NA 136 135  K 4.8 4.7  CL 108 106  CO2 21* 21*  GLUCOSE 288* 203*  BUN 54* 59*  CREATININE 2.01* 2.35*  CALCIUM 8.6* 8.5*    Liver Function Tests: Recent Labs  Lab 05/27/18 1152  AST 11*  ALT 10  ALKPHOS 43  BILITOT 1.0  PROT 6.3*  ALBUMIN 3.6   No results for input(s): LIPASE, AMYLASE in the last 168 hours. No results for input(s): AMMONIA in the last 168 hours.  CBC: Recent Labs  Lab 05/27/18 1152  WBC 8.5  NEUTROABS 7.5*  HGB 9.3*  HCT 26.6*  MCV 98.2  PLT 189    Cardiac Enzymes: Recent Labs  Lab 05/27/18 1152  TROPONINI <0.03    BNP: Invalid input(s): POCBNP  CBG: Recent Labs  Lab 05/27/18 1726 05/27/18 2046 05/28/18 0805  GLUCAP 220* 177* 186*    Microbiology: No results found for this or  any previous visit.  Coagulation Studies: No results for input(s): LABPROT, INR in the last 72 hours.  Urinalysis: Recent Labs    05/27/18 1343  COLORURINE YELLOW*  LABSPEC 1.018  PHURINE 5.0  GLUCOSEU 150*  HGBUR NEGATIVE  BILIRUBINUR NEGATIVE  KETONESUR NEGATIVE  PROTEINUR NEGATIVE  NITRITE NEGATIVE  LEUKOCYTESUR NEGATIVE      Imaging: Dg Chest 1 View  Result Date: 05/28/2018 CLINICAL DATA:  Pneumonia. EXAM: CHEST  1 VIEW COMPARISON:  Frontal and lateral views yesterday. FINDINGS: Unchanged cardiomegaly and mediastinal contours. Decreased vascular congestion from prior. Improved bibasilar atelectasis. No new focal airspace disease. No pleural fluid or pneumothorax. Unchanged osseous structures. IMPRESSION: Improved right basilar atelectasis. No focal airspace disease to suggest pneumonia. Electronically Signed   By: Keith Rake M.D.   On: 05/28/2018 05:28   Dg Chest 2 View  Result Date: 05/27/2018 CLINICAL DATA:  Weakness EXAM: CHEST - 2 VIEW COMPARISON:  Chest CT 08/06/2007 FINDINGS: Cardiomegaly and vascular pedicle widening accentuated by mediastinal fat. Mild reticulation at the right base that appears linear, streaky. There is no edema, consolidation, effusion, or pneumothorax. Midthoracic disc narrowing. IMPRESSION: Mild atelectatic type opacity at the right base. Electronically Signed   By: Monte Fantasia M.D.   On: 05/27/2018 12:29     Medications:   . sodium chloride     .  aspirin EC  81 mg Oral QPM  . cholecalciferol  1,000 Units Oral Daily  . ezetimibe  10 mg Oral Daily  . heparin  5,000 Units Subcutaneous Q8H  . Influenza vac split quadrivalent PF  0.5 mL Intramuscular Tomorrow-1000  . insulin aspart  0-5 Units Subcutaneous QHS  . insulin aspart  0-9 Units Subcutaneous TID WC  . levothyroxine  88 mcg Oral QAC breakfast  . sodium bicarbonate  1,300 mg Oral BID  . sodium chloride flush  3 mL Intravenous Q12H   sodium chloride, acetaminophen **OR**  acetaminophen, hydrALAZINE, ondansetron **OR** ondansetron (ZOFRAN) IV, senna-docusate, sodium chloride flush  Assessment/ Plan:  82 y.o. female ith past medical history of diabetes mellitus diagnosed 2001, hypothyroidism, vitamin D deficiency, hyperlipidemia, osteoarthritis, left laparoscopic radical nephrectomy for renal cell carcinoma and left oophroectomy 10/12/07, hysterectomy with right oophorectomy, bladder tack, cholecystectomy, right breast biopsy x2, right total knee replacement, subdural hematoma  1. Chronic kidney disease stage IV/proteinuria.  Renal function worse today.  EGFR currently down to 19 with diuresis yesterday.  Therefore we will hold further Lasix doses.  I have advised the patient to increase fluid intake today.   2.  Secondary hyperparathyroidism.  No indication for binders or calcitriol at the moment.  Continue to monitor bone mineral metabolism parameters as an outpatient.  3.  Metabolic acidosis.  Her bicarbonate currently 21.  Maintain the patient on sodium bicarbonate 1300 mg p.o. twice daily.  4.  Lower extremity edema/shortness of breath.  Awaiting 2D echocardiogram results this a.m.  We have held Lasix given worsening in renal function.   LOS: 1 Cartez Mogle 9/13/20199:58 AM

## 2018-05-28 NOTE — Progress Notes (Signed)
Renal function worse this AM, therefore will hold lasix for now.

## 2018-05-29 DIAGNOSIS — Z23 Encounter for immunization: Secondary | ICD-10-CM | POA: Diagnosis not present

## 2018-05-29 LAB — GLUCOSE, CAPILLARY
Glucose-Capillary: 147 mg/dL — ABNORMAL HIGH (ref 70–99)
Glucose-Capillary: 209 mg/dL — ABNORMAL HIGH (ref 70–99)

## 2018-05-29 LAB — BASIC METABOLIC PANEL
Anion gap: 8 (ref 5–15)
BUN: 71 mg/dL — AB (ref 8–23)
CO2: 22 mmol/L (ref 22–32)
Calcium: 8.6 mg/dL — ABNORMAL LOW (ref 8.9–10.3)
Chloride: 106 mmol/L (ref 98–111)
Creatinine, Ser: 2.37 mg/dL — ABNORMAL HIGH (ref 0.44–1.00)
GFR calc Af Amer: 21 mL/min — ABNORMAL LOW (ref >60)
GFR, EST NON AFRICAN AMERICAN: 18 mL/min — AB (ref >60)
GLUCOSE: 150 mg/dL — AB (ref 70–99)
POTASSIUM: 4.5 mmol/L (ref 3.5–5.1)
Sodium: 136 mmol/L (ref 135–145)

## 2018-05-29 NOTE — Progress Notes (Signed)
[facility]  ROUNDING NOTE   Subjective:  Late entry. Patient feeling much better as compared to admission. However BUN remains high at 71 with a creatinine of 2.37. She did receive a significant amount of Lasix at admission. Shortness of breath improved.   Objective:  Vital signs in last 24 hours:  Temp:  [98.6 F (37 C)-100.3 F (37.9 C)] 98.6 F (37 C) (09/14 0723) Pulse Rate:  [70-72] 72 (09/14 0723) Resp:  [14-18] 18 (09/14 0333) BP: (127-153)/(52-85) 140/52 (09/14 0723) SpO2:  [96 %-98 %] 96 % (09/14 0723) Weight:  [109.1 kg] 109.1 kg (09/14 0333)  Weight change: 7.076 kg Filed Weights   05/27/18 1656 05/28/18 0359 05/29/18 0333  Weight: 110 kg 108 kg 109.1 kg    Intake/Output: I/O last 3 completed shifts: In: 52 [P.O.:120; I.V.:3] Out: 0    Intake/Output this shift:  No intake/output data recorded.  Physical Exam: General: No acute distress  Head: Normocephalic, atraumatic. Moist oral mucosal membranes  Eyes: Anicteric  Neck: Supple, trachea midline  Lungs:  Minimal rales, normal effort  Heart: S1S2 no rubs  Abdomen:  Soft, nontender, bowel sounds present  Extremities: trace peripheral edema.  Neurologic: Awake, alert, following commands  Skin: No lesions       Basic Metabolic Panel: Recent Labs  Lab 05/27/18 1152 05/28/18 0606 05/29/18 0558  NA 136 135 136  K 4.8 4.7 4.5  CL 108 106 106  CO2 21* 21* 22  GLUCOSE 288* 203* 150*  BUN 54* 59* 71*  CREATININE 2.01* 2.35* 2.37*  CALCIUM 8.6* 8.5* 8.6*    Liver Function Tests: Recent Labs  Lab 05/27/18 1152  AST 11*  ALT 10  ALKPHOS 43  BILITOT 1.0  PROT 6.3*  ALBUMIN 3.6   No results for input(s): LIPASE, AMYLASE in the last 168 hours. No results for input(s): AMMONIA in the last 168 hours.  CBC: Recent Labs  Lab 05/27/18 1152  WBC 8.5  NEUTROABS 7.5*  HGB 9.3*  HCT 26.6*  MCV 98.2  PLT 189    Cardiac Enzymes: Recent Labs  Lab 05/27/18 1152  TROPONINI  <0.03    BNP: Invalid input(s): POCBNP  CBG: Recent Labs  Lab 05/28/18 1131 05/28/18 1646 05/28/18 2058 05/29/18 0725 05/29/18 1131  GLUCAP 250* 197* 164* 147* 209*    Microbiology: No results found for this or any previous visit.  Coagulation Studies: No results for input(s): LABPROT, INR in the last 72 hours.  Urinalysis: Recent Labs    05/27/18 1343  COLORURINE YELLOW*  LABSPEC 1.018  PHURINE 5.0  GLUCOSEU 150*  HGBUR NEGATIVE  BILIRUBINUR NEGATIVE  KETONESUR NEGATIVE  PROTEINUR NEGATIVE  NITRITE NEGATIVE  LEUKOCYTESUR NEGATIVE      Imaging: Dg Chest 1 View  Result Date: 05/28/2018 CLINICAL DATA:  Pneumonia. EXAM: CHEST  1 VIEW COMPARISON:  Frontal and lateral views yesterday. FINDINGS: Unchanged cardiomegaly and mediastinal contours. Decreased vascular congestion from prior. Improved bibasilar atelectasis. No new focal airspace disease. No pleural fluid or pneumothorax. Unchanged osseous structures. IMPRESSION: Improved right basilar atelectasis. No focal airspace disease to suggest pneumonia. Electronically Signed   By: Keith Rake M.D.   On: 05/28/2018 05:28     Medications:   . sodium chloride     . aspirin EC  81 mg Oral QPM  . cholecalciferol  1,000 Units Oral Daily  . ezetimibe  10 mg Oral Daily  . heparin  5,000 Units Subcutaneous Q8H  . insulin aspart  0-5 Units Subcutaneous QHS  .  insulin aspart  0-9 Units Subcutaneous TID WC  . levothyroxine  88 mcg Oral QAC breakfast  . sodium bicarbonate  1,300 mg Oral BID  . sodium chloride flush  3 mL Intravenous Q12H   sodium chloride, acetaminophen **OR** acetaminophen, hydrALAZINE, ondansetron **OR** ondansetron (ZOFRAN) IV, senna-docusate, sodium chloride flush  Assessment/ Plan:  82 y.o. female ith past medical history of diabetes mellitus diagnosed 2001, hypothyroidism, vitamin D deficiency, hyperlipidemia, osteoarthritis, left laparoscopic radical nephrectomy for renal cell carcinoma and  left oophroectomy 10/12/07, hysterectomy with right oophorectomy, bladder tack, cholecystectomy, right breast biopsy x2, right total knee replacement, subdural hematoma  1. Chronic kidney disease stage IV/proteinuria.  BUN currently 71 with a creatinine of 2.37.  Case discussed with Dr. Posey Pronto.  We have recommended that she reinitiate Lasix on Monday.  We plan to see the patient back in the office for at least labs early next week.  2.  Secondary hyperparathyroidism.  We will continue to monitor bone mineral metabolism parameters as an outpatient.  3.  Metabolic acidosis.  Maintain the patient on sodium bicarbonate 1300 mg p.o. twice daily for now.  4.  Lower extremity edema/shortness of breath.  Patient does appear to have diastolic dysfunction.  Therefore blood pressure control will be imperative as an outpatient.  We will continue to monitor this as an outpatient.  She will resume Lasix on Monday.   LOS: 2 Shaneya Taketa 9/14/20193:22 PM

## 2018-05-29 NOTE — Discharge Instructions (Signed)
Your Losartan (BP med) has been held for now due to your elevated creatinine

## 2018-05-29 NOTE — Care Management Note (Signed)
Case Management Note  Patient Details  Name: Gina Michael MRN: [id number] Date of Birth: 1936-06-28  Subjective/Objective:   Patient had previously qualified for home O2 but has been on room air for over 24 hours and saturations have maintained in the upper 90'2 without any oxygen requirement. Patient denies SOB or any clinical indications for O2. I also informed her Pt recommended Home health but she lives with family and has no safety concerns. Tells me she is able to complete activities of daily living without issue and feels she is back to baseline from a mobility standpoint. No further RNCM needs.Family to provide transport.  Ines Bloomer RN BSN RNCM [phone]                 Action/Plan:   Expected Discharge Date:  05/29/18               Expected Discharge Plan:     In-House Referral:     Discharge planning Services  CM Consult  Post Acute Care Choice:  Home Health Choice offered to:     DME Arranged:    DME Agency:     HH Arranged:  Patient Refused [facility] Agency:     Status of Service:  Completed, signed off  If discussed at [employer], dates discussed:    Additional Comments:  Latanya Maudlin, RN 05/29/2018, 11:29 AM

## 2018-05-29 NOTE — Progress Notes (Addendum)
RN removed IV, gave flu vaccine, and reviewed discharge instructions.  Patient left in a private vehicle with her sister.  Phillis Knack, RN

## 2018-05-29 NOTE — Discharge Summary (Signed)
[facility] [place] NAME: Gina Michael    MR#:  [id number]  DATE OF BIRTH:  09/18/1935  DATE OF ADMISSION:  05/27/2018 ADMITTING PHYSICIAN: Bettey Costa, MD  DATE OF DISCHARGE: 05/29/2018  PRIMARY CARE PHYSICIAN: Jerrol Banana., MD    ADMISSION DIAGNOSIS:  Hypoxemia [R09.02] Weakness [R53.1] PNA (pneumonia) [J18.9]  DISCHARGE DIAGNOSIS:  Acute on chronic mild diastolic CHF Chronic leg edema Acute on CKD-IV  SECONDARY DIAGNOSIS:   Past Medical History:  Diagnosis Date  . Diabetes mellitus without complication ([place])    type 2  . Hyperlipidemia   . Hypertension   . Obesity     HOSPITAL COURSE:   82 year old female with history of chronic kidney disease stage IV, diabetes and PMR who presents to the ER due to generalized weakness and found to have hypoxia.  1.Acute hypoxic respiratory failure suspected due to mild acute on chronic CHF-diastolic - Chest x-ray shows atelectasis however patient reports lower extremity edema.   echocardiogram EF 60-65% -Incentive spirometer ordered Repeat chest x-ray no pneumonia Weaned oxygen to room air sats >92% No resp distress -pt received lasix in the ER -lasix held and can be resumed from Monday as before per Dr Holley Raring  2. Acute on chronic kidney disease stage IV: With metabolic acidosis Nephrology consultation requested seen by Dr. Lurline Del it Hold Lasix, losartan for now; patient has received 1 dose of IV Lasix in the emergency department Creatinine at 2.35 (baseline 1.8-1.9) Encouraged patient to take p.o. Fluids Sodium bicarb 1300 mg p.o. twice daily  3. Diabetes: Continue siding scale with ADA diet Diabetes nurse consultation requested Since CHF was mild and now better will resume po home meds. -will defer to Dr Rosanna Randy to consider other po meds given her chronic kidney issues  4. Hypothyroidism - Continue Synthroid  5. Essential hypertension:  Hold losartan for now due to acute kidney injury PRN hydralazine ordered Pt aware  6. Hyperlipidemia: Continue Zetia  overall improving. Ok to go home per Nephro Pt agreeable HHPT CONSULTS OBTAINED:  Treatment Team:  Anthonette Legato, MD  DRUG ALLERGIES:   Allergies  Allergen Reactions  . Amoxicillin-Pot Clavulanate Anaphylaxis and Shortness Of Breath  . Clindamycin Anaphylaxis and Shortness Of Breath    Other reaction(s): Other (See Comments) Other Reaction: Rash; swelling in lips  . Codeine Shortness Of Breath  . Oxycodone Rash and Shortness Of Breath  . Penicillins Anaphylaxis and Shortness Of Breath  . Cefprozil   . Citric Acid Sodium  [Sodium Citrate]   . Vancomycin Hcl     DISCHARGE MEDICATIONS:   Allergies as of 05/29/2018      Reactions   Amoxicillin-pot Clavulanate Anaphylaxis, Shortness Of Breath   Clindamycin Anaphylaxis, Shortness Of Breath   Other reaction(s): Other (See Comments) Other Reaction: Rash; swelling in lips   Codeine Shortness Of Breath   Oxycodone Rash, Shortness Of Breath   Penicillins Anaphylaxis, Shortness Of Breath   Cefprozil    Citric Acid Sodium  [sodium Citrate]    Vancomycin Hcl       Medication List    STOP taking these medications   losartan 100 MG tablet Commonly known as:  COZAAR   ONETOUCH DELICA LANCETS 14G Misc     TAKE these medications   aspirin 81 MG EC tablet Take 81 mg by mouth every evening.   Biotin 10000 MCG Tabs Take 1 tablet by mouth daily.   Cholecalciferol 1000 units tablet Take 1,000 Units by  mouth daily.   Docusate Sodium 100 MG capsule Take 100 mg by mouth as needed.   ezetimibe 10 MG tablet Commonly known as:  ZETIA Take 1 tablet (10 mg total) by mouth daily.   furosemide 40 MG tablet Commonly known as:  LASIX Take 40 mg by mouth every Monday, Wednesday, and Friday.   glimepiride 4 MG tablet Commonly known as:  AMARYL Take 1 tablet (4 mg total) by mouth daily.   levothyroxine 88  MCG tablet Commonly known as:  SYNTHROID, LEVOTHROID TAKE ONE TABLET BY MOUTH EVERY DAY   pioglitazone 30 MG tablet Commonly known as:  ACTOS TAKE ONE TABLET EVERY DAY   ranitidine 300 MG capsule Commonly known as:  ZANTAC Take 300 mg by mouth at bedtime as needed for heartburn.   sodium bicarbonate 650 MG tablet Take 1,300 mg by mouth 2 (two) times daily.       If you experience worsening of your admission symptoms, develop shortness of breath, life threatening emergency, suicidal or homicidal thoughts you must seek medical attention immediately by calling 911 or calling your MD immediately  if symptoms less severe.  You Must read complete instructions/literature along with all the possible adverse reactions/side effects for all the Medicines you take and that have been prescribed to you. Take any new Medicines after you have completely understood and accept all the possible adverse reactions/side effects.   Please note  You were cared for by a hospitalist during your hospital stay. If you have any questions about your discharge medications or the care you received while you were in the hospital after you are discharged, you can call the unit and asked to speak with the hospitalist on call if the hospitalist that took care of you is not available. Once you are discharged, your primary care physician will handle any further medical issues. Please note that NO REFILLS for any discharge medications will be authorized once you are discharged, as it is imperative that you return to your primary care physician (or establish a relationship with a primary care physician if you do not have one) for your aftercare needs so that they can reassess your need for medications and monitor your lab values. Today   SUBJECTIVE  Doing well. Out int he chair   VITAL SIGNS:  Blood pressure (!) 140/52, pulse 72, temperature 98.6 F (37 C), temperature source Oral, resp. rate 18, height 5\' 3"  (1.6 m),  weight 109.1 kg, SpO2 96 %.  I/O:    Intake/Output Summary (Last 24 hours) at 05/29/2018 1116 Last data filed at 05/29/2018 0100 Gross per 24 hour  Intake -  Output 0 ml  Net 0 ml    PHYSICAL EXAMINATION:  GENERAL:  82 y.o.-year-old patient lying in the bed with no acute distress. obese EYES: Pupils equal, round, reactive to light and accommodation. No scleral icterus. Extraocular muscles intact.  HEENT: Head atraumatic, normocephalic. Oropharynx and nasopharynx clear.  NECK:  Supple, no jugular venous distention. No thyroid enlargement, no tenderness.  LUNGS: Normal breath sounds bilaterally, no wheezing, rales,rhonchi or crepitation. No use of accessory muscles of respiration.  CARDIOVASCULAR: S1, S2 normal. No murmurs, rubs, or gallops.  ABDOMEN: Soft, non-tender, non-distended. Bowel sounds present. No organomegaly or mass.  EXTREMITIES: ++ pedal edema, no cyanosis, or clubbing.  NEUROLOGIC: Cranial nerves II through XII are intact. Muscle strength 5/5 in all extremities. Sensation intact. Gait not checked.  PSYCHIATRIC: The patient is alert and oriented x 3.  SKIN: No obvious rash, lesion,  or ulcer.   DATA REVIEW:   CBC  Recent Labs  Lab 05/27/18 1152  WBC 8.5  HGB 9.3*  HCT 26.6*  PLT 189    Chemistries  Recent Labs  Lab 05/27/18 1152  05/29/18 0558  NA 136   < > 136  K 4.8   < > 4.5  CL 108   < > 106  CO2 21*   < > 22  GLUCOSE 288*   < > 150*  BUN 54*   < > 71*  CREATININE 2.01*   < > 2.37*  CALCIUM 8.6*   < > 8.6*  AST 11*  --   --   ALT 10  --   --   ALKPHOS 43  --   --   BILITOT 1.0  --   --    < > = values in this interval not displayed.    Microbiology Results   No results found for this or any previous visit (from the past 240 hour(s)).  RADIOLOGY:  Dg Chest 1 View  Result Date: 05/28/2018 CLINICAL DATA:  Pneumonia. EXAM: CHEST  1 VIEW COMPARISON:  Frontal and lateral views yesterday. FINDINGS: Unchanged cardiomegaly and mediastinal  contours. Decreased vascular congestion from prior. Improved bibasilar atelectasis. No new focal airspace disease. No pleural fluid or pneumothorax. Unchanged osseous structures. IMPRESSION: Improved right basilar atelectasis. No focal airspace disease to suggest pneumonia. Electronically Signed   By: Keith Rake M.D.   On: 05/28/2018 05:28   Dg Chest 2 View  Result Date: 05/27/2018 CLINICAL DATA:  Weakness EXAM: CHEST - 2 VIEW COMPARISON:  Chest CT 08/06/2007 FINDINGS: Cardiomegaly and vascular pedicle widening accentuated by mediastinal fat. Mild reticulation at the right base that appears linear, streaky. There is no edema, consolidation, effusion, or pneumothorax. Midthoracic disc narrowing. IMPRESSION: Mild atelectatic type opacity at the right base. Electronically Signed   By: Monte Fantasia M.D.   On: 05/27/2018 12:29     Management plans discussed with the patient, family and they are in agreement.  CODE STATUS:     Code Status Orders  (From admission, onward)         Start     Ordered   05/27/18 1737  Do not attempt resuscitation (DNR)  Continuous    Question Answer Comment  In the event of cardiac or respiratory ARREST Do not call a "code blue"   In the event of cardiac or respiratory ARREST Do not perform Intubation, CPR, defibrillation or ACLS   In the event of cardiac or respiratory ARREST Use medication by any route, position, wound care, and other measures to relive pain and suffering. May use oxygen, suction and manual treatment of airway obstruction as needed for comfort.      05/27/18 1737        Code Status History    Date Active Date Inactive Code Status Order ID Comments User Context   05/27/2018 1641 05/27/2018 1737 Full Code [id number]  Bettey Costa, MD Inpatient   05/27/2018 1540 05/27/2018 1641 DNR [id number]  Bettey Costa, MD ED    Advance Directive Documentation     Most Recent Value  Type of Advance Directive  Healthcare Power of [place], Living will   Pre-existing out of facility DNR order (yellow form or pink MOST form)  -  "MOST" Form in Place?  -      TOTAL TIME TAKING CARE OF THIS PATIENT: **40 minutes.    Fritzi Mandes M.D on 05/29/2018 at 11:16 AM  Between 7am to 6pm - Pager - [phone] After 6pm go to [email or web address] - password [email or web address] [facility]  Office  [phone]  CC: Primary care physician; Jerrol Banana., MD

## 2018-05-31 ENCOUNTER — Telehealth: Payer: Self-pay

## 2018-06-01 NOTE — Telephone Encounter (Signed)
Transition Care Management Follow-up Telephone Call  Date of discharge and from where: [facility] on 05/29/18  How have you been since you were released from the hospital? Gradually getting back to herself. Drinking more water to keep hydrated and making sure to take meds. Avoiding sweets currently. Still tired. Declines any worsening or new s/s.   Any questions or concerns? No   Items Reviewed:  Did the pt receive and understand the discharge instructions provided? Yes   Medications obtained and verified? Yes   Any new allergies since your discharge? No   Dietary orders reviewed? Yes  Do you have support at home? Yes   Other (ie: DME, Home Health, etc) N/A  Functional Questionnaire: (I = Independent and D = Dependent)  Bathing/Dressing- I   Meal Prep- I  Eating- I  Maintaining continence- I  Transferring/Ambulation- I  Managing Meds- I   Follow up appointments reviewed:    PCP Hospital f/u appt confirmed? No, pt declined scheduling a HFU with PCP.  [facility] f/u appt confirmed? Yes, pt to schedule an apt to see Dr Holley Raring.  Are transportation arrangements needed? No  If their condition worsens, is the pt aware to call  their PCP or go to the ED? Yes  Was the patient provided with contact information for the PCP's office or ED? Yes  Was the pt encouraged to call back with questions or concerns? Yes

## 2018-06-08 ENCOUNTER — Telehealth: Payer: Self-pay

## 2018-06-08 NOTE — Telephone Encounter (Signed)
Patient called saying that her FBS this morning was 279. She reports that her blood sugars have been running high lately, and it was also high during her ER visit about 2 weeks ago.   She is currently asymptomatic. She denies any blurred vision, nausea, dizziness, or light headedness.   When reviewing her med list, patient reports that she is no longer on glimepiride 4mg . She reports that she was told to stop this back in February (about 29mos ago).   Should patient start back on this to get her blood sugars under control? Please advise. Thanks!

## 2018-06-15 ENCOUNTER — Other Ambulatory Visit: Payer: Self-pay | Admitting: Family Medicine

## 2018-06-29 ENCOUNTER — Ambulatory Visit (INDEPENDENT_AMBULATORY_CARE_PROVIDER_SITE_OTHER): Payer: Medicare HMO | Admitting: Family Medicine

## 2018-06-29 ENCOUNTER — Other Ambulatory Visit: Payer: Self-pay

## 2018-06-29 VITALS — BP 125/80 | HR 76 | Temp 98.0°F | Resp 16 | Wt 236.0 lb

## 2018-06-29 DIAGNOSIS — E1121 Type 2 diabetes mellitus with diabetic nephropathy: Secondary | ICD-10-CM

## 2018-06-29 DIAGNOSIS — N289 Disorder of kidney and ureter, unspecified: Secondary | ICD-10-CM | POA: Diagnosis not present

## 2018-06-29 DIAGNOSIS — E1142 Type 2 diabetes mellitus with diabetic polyneuropathy: Secondary | ICD-10-CM | POA: Diagnosis not present

## 2018-06-29 DIAGNOSIS — E039 Hypothyroidism, unspecified: Secondary | ICD-10-CM | POA: Diagnosis not present

## 2018-06-29 MED ORDER — GLIMEPIRIDE 4 MG PO TABS: 4.0000 mg | ORAL_TABLET | Freq: Every day | ORAL | 11 refills | Status: AC

## 2018-06-29 NOTE — Progress Notes (Signed)
Gina Michael  MRN: [id number] DOB: [birth year]  Subjective:  HPI   The patient is an 82 year old female whop presents for evaluation of glucose.  She had been seen in the ED and then admitted to the hospital in September for congestive heart failure.  Her glucose was elevated during her admission and has remained elevated since then .   She had been on Glimepiride in the past but this was discontinued in February as her readings at that time were much better.  The patient was started on Lasix three times per week and prn while in the hospital as well as being instructed to discontinue her Losartan. She is getting ready to move to a retirement village in [place] to be clow to one of her sons.  Patient Active Problem List   Diagnosis Date Noted  . Hypoxia 05/27/2018  . Polymyalgia rheumatica syndrome ([place]) 04/24/2016  . Diabetes ([place]) 05/09/2015  . Bell palsy 02/19/2015  . Chronic venous insufficiency 02/19/2015  . DDD (degenerative disc disease), lumbar 02/19/2015  . Diabetic neuropathy ([place]) 02/19/2015  . Urinary system disease 02/19/2015  . Edema extremities 02/19/2015  . Bloodgood disease 02/19/2015  . Acid reflux 02/19/2015  . Cephalalgia 02/19/2015  . H/O adenomatous polyp of colon 02/19/2015  . HLD (hyperlipidemia) 02/19/2015  . Heart & renal disease, hypertensive, with heart failure ([place]) 02/19/2015  . Adult hypothyroidism 02/19/2015  . Neuritis or radiculitis due to rupture of lumbar intervertebral disc 02/19/2015  . Lumbar canal stenosis 02/19/2015  . Cancer of kidney ([place]) 02/19/2015  . Adiposity 02/19/2015  . Arthritis, degenerative 02/19/2015  . Impaired renal function 02/19/2015  . Sialoadenitis 02/19/2015  . Diabetic peripheral neuropathy associated with type 2 diabetes mellitus ([place]) 02/19/2015  . Phlebectasia 02/19/2015  . Angiitis ([place]) 02/19/2015  . Avitaminosis D 02/19/2015    Past Medical History:  Diagnosis Date  . Diabetes mellitus without  complication ([place])    type 2  . Hyperlipidemia   . Hypertension   . Obesity     Social History   Socioeconomic History  . Marital status: Married    Spouse name: Sterling  . Number of children: 2  . Years of education: associates  . Highest education level: Associate degree: occupational, Hotel manager, or vocational program  Occupational History  . Occupation: retired  Scientific laboratory technician  . Financial resource strain: Not hard at all  . Food insecurity:    Worry: Never true    Inability: Never true  . Transportation needs:    Medical: No    Non-medical: No  Tobacco Use  . Smoking status: Never Smoker  . Smokeless tobacco: Never Used  Substance and Sexual Activity  . Alcohol use: No  . Drug use: No  . Sexual activity: Not Currently  Lifestyle  . Physical activity:    Days per week: Not on file    Minutes per session: Not on file  . Stress: Very much  Relationships  . Social connections:    Talks on phone: Not on file    Gets together: Not on file    Attends religious service: Not on file    Active member of club or organization: Not on file    Attends meetings of clubs or organizations: Not on file    Relationship status: Not on file  . Intimate partner violence:    Fear of current or ex partner: Not on file    Emotionally abused: Not on file    Physically abused: Not  on file    Forced sexual activity: Not on file  Other Topics Concern  . Not on file  Social History Narrative  . Not on file    Outpatient Encounter Medications as of 06/29/2018  Medication Sig  . aspirin 81 MG EC tablet Take 81 mg by mouth every evening.   . Biotin 10000 MCG TABS Take 1 tablet by mouth daily.   . Cholecalciferol 1000 units tablet Take 1,000 Units by mouth daily.   Mariane Baumgarten Sodium 100 MG capsule Take 100 mg by mouth as needed.   . ezetimibe (ZETIA) 10 MG tablet Take 1 tablet (10 mg total) by mouth daily.  . furosemide (LASIX) 40 MG tablet Take 40 mg by mouth every Monday, Wednesday,  and Friday.   Marland Kitchen glimepiride (AMARYL) 4 MG tablet Take 1 tablet (4 mg total) by mouth daily.  Marland Kitchen levothyroxine (SYNTHROID, LEVOTHROID) 88 MCG tablet TAKE ONE TABLET EVERY DAY  . pioglitazone (ACTOS) 30 MG tablet TAKE ONE TABLET BY MOUTH EVERY DAY  . ranitidine (ZANTAC) 300 MG capsule Take 300 mg by mouth at bedtime as needed for heartburn.   . sodium bicarbonate 650 MG tablet Take 1,300 mg by mouth 2 (two) times daily.    No facility-administered encounter medications on file as of 06/29/2018.     Allergies  Allergen Reactions  . Amoxicillin-Pot Clavulanate Anaphylaxis and Shortness Of Breath  . Clindamycin Anaphylaxis and Shortness Of Breath    Other reaction(s): Other (See Comments) Other Reaction: Rash; swelling in lips  . Codeine Shortness Of Breath  . Oxycodone Rash and Shortness Of Breath  . Penicillins Anaphylaxis and Shortness Of Breath  . Cefprozil   . Citric Acid Sodium  [Sodium Citrate]   . Vancomycin Hcl     Review of Systems  Constitutional: Negative for fever and malaise/fatigue.  Eyes: Negative.   Respiratory: Negative for cough, shortness of breath and wheezing.   Cardiovascular: Negative for chest pain, palpitations and leg swelling.  Gastrointestinal: Negative.   Genitourinary: Negative.   Skin: Negative.   Endo/Heme/Allergies: Negative.   Psychiatric/Behavioral: Negative.     Objective:  BP 125/80 (BP Location: Right Arm, Patient Position: Sitting, Cuff Size: Normal)   Pulse 76   Temp 98 F (36.7 C) (Oral)   Resp 16   Wt 236 lb (107 kg)   BMI 41.81 kg/m   Physical Exam  Constitutional: She is oriented to person, place, and time and well-developed, well-nourished, and in no distress.  HENT:  Head: Normocephalic and atraumatic.  Eyes: Conjunctivae are normal. No scleral icterus.  Neck: No thyromegaly present.  Cardiovascular: Normal rate, regular rhythm and normal heart sounds.  Pulmonary/Chest: Effort normal and breath sounds normal.  Abdominal:  Soft.  Lymphadenopathy:    She has no cervical adenopathy.  Neurological: She is alert and oriented to person, place, and time. Gait normal. GCS score is 15.  Skin: Skin is warm and dry.  Psychiatric: Mood, memory, affect and judgment normal.    Assessment and Plan :  1. Diabetic peripheral neuropathy associated with type 2 diabetes mellitus (HCC)   BS up in recent weeks.RBS daily--RTC 2-3 weeks.Restart Glimiperide and recheck .  2. Adult hypothyroidism   3. Type 2 diabetes mellitus with diabetic nephropathy, without long-term current use of insulin (HCC)  - glimepiride (AMARYL) 4 MG tablet; Take 1 tablet (4 mg total) by mouth daily.  Dispense: 30 tablet; Refill: 11  4. Impaired renal function Followed by nephrology. - Comprehensive metabolic panel 5.Mourning  Recent loss of husband.  I have done the exam and reviewed the chart and it is accurate to the best of my knowledge. Development worker, community has been used and  any errors in dictation or transcription are unintentional. Miguel Aschoff M.D. [facility]

## 2018-06-29 NOTE — Patient Instructions (Signed)
Restart Glimepiride 

## 2018-06-30 LAB — COMPREHENSIVE METABOLIC PANEL
A/G RATIO: 1.6 (ref 1.2–2.2)
ALBUMIN: 4.3 g/dL (ref 3.5–4.7)
ALK PHOS: 50 IU/L (ref 39–117)
ALT: 9 IU/L (ref 0–32)
AST: 11 IU/L (ref 0–40)
BUN / CREAT RATIO: 31 — AB (ref 12–28)
BUN: 59 mg/dL — ABNORMAL HIGH (ref 8–27)
Bilirubin Total: 0.7 mg/dL (ref 0.0–1.2)
CO2: 23 mmol/L (ref 20–29)
Calcium: 9.8 mg/dL (ref 8.7–10.3)
Chloride: 99 mmol/L (ref 96–106)
Creatinine, Ser: 1.88 mg/dL — ABNORMAL HIGH (ref 0.57–1.00)
GFR calc Af Amer: 29 mL/min/{1.73_m2} — ABNORMAL LOW (ref >59)
GFR calc non Af Amer: 25 mL/min/{1.73_m2} — ABNORMAL LOW (ref >59)
GLOBULIN, TOTAL: 2.7 g/dL (ref 1.5–4.5)
Glucose: 138 mg/dL — ABNORMAL HIGH (ref 65–99)
POTASSIUM: 5 mmol/L (ref 3.5–5.2)
Sodium: 138 mmol/L (ref 134–144)
Total Protein: 7 g/dL (ref 6.0–8.5)

## 2018-07-01 ENCOUNTER — Telehealth: Payer: Self-pay

## 2018-07-01 NOTE — Telephone Encounter (Signed)
Left message advising pt.  (Per DPR)  Thanks,   -Ruth Kovich  

## 2018-07-01 NOTE — Telephone Encounter (Signed)
-----   Message from Jerrol Banana., MD sent at 07/01/2018  8:46 AM EDT ----- Stable.

## 2018-07-13 ENCOUNTER — Ambulatory Visit: Payer: Self-pay | Admitting: Family Medicine

## 2018-07-13 ENCOUNTER — Telehealth: Payer: Self-pay | Admitting: Family Medicine

## 2018-07-13 NOTE — Telephone Encounter (Signed)
Pt states she is following up on the form for [place] in [employer].  States at her last visit she brought it in and needs to have it sent in soon so she can be accepted into the community.   Pt requesting call back on the status of the form.

## 2018-07-13 NOTE — Telephone Encounter (Signed)
Form has been completed and patient has been notified.

## 2018-07-27 DIAGNOSIS — E113393 Type 2 diabetes mellitus with moderate nonproliferative diabetic retinopathy without macular edema, bilateral: Secondary | ICD-10-CM | POA: Diagnosis not present

## 2018-08-04 ENCOUNTER — Encounter: Payer: Self-pay | Admitting: Family Medicine

## 2018-08-04 ENCOUNTER — Ambulatory Visit: Payer: Medicare HMO | Admitting: Family Medicine

## 2018-08-04 VITALS — BP 126/62 | HR 78 | Temp 98.0°F | Resp 16 | Wt 234.0 lb

## 2018-08-04 DIAGNOSIS — E039 Hypothyroidism, unspecified: Secondary | ICD-10-CM

## 2018-08-04 DIAGNOSIS — Z6841 Body Mass Index (BMI) 40.0 and over, adult: Secondary | ICD-10-CM

## 2018-08-04 DIAGNOSIS — C649 Malignant neoplasm of unspecified kidney, except renal pelvis: Secondary | ICD-10-CM | POA: Diagnosis not present

## 2018-08-04 DIAGNOSIS — E1142 Type 2 diabetes mellitus with diabetic polyneuropathy: Secondary | ICD-10-CM

## 2018-08-04 DIAGNOSIS — M1990 Unspecified osteoarthritis, unspecified site: Secondary | ICD-10-CM | POA: Diagnosis not present

## 2018-08-04 DIAGNOSIS — E1121 Type 2 diabetes mellitus with diabetic nephropathy: Secondary | ICD-10-CM | POA: Diagnosis not present

## 2018-08-04 DIAGNOSIS — N289 Disorder of kidney and ureter, unspecified: Secondary | ICD-10-CM

## 2018-08-04 LAB — POCT GLYCOSYLATED HEMOGLOBIN (HGB A1C)
Est. average glucose Bld gHb Est-mCnc: 171
Hemoglobin A1C: 7.6 % — AB (ref 4.0–5.6)

## 2018-08-04 NOTE — Progress Notes (Signed)
Patient: Gina Michael Female    DOB: 1936-01-23   82 y.o.   MRN: [id number] Visit Date: 08/04/2018  Today's Provider: Wilhemena Durie, MD   Chief Complaint  Patient presents with  . Diabetes   Subjective:    HPI  Diabetes Mellitus Type II, Follow-up:   Lab Results  Component Value Date   HGBA1C 7.6 (A) 08/04/2018   HGBA1C 7.4 (H) 04/29/2018   HGBA1C 5.9 (H) 11/10/2017    Last seen for diabetes 1 months ago.  Management since then includes started back on glimepiride 4 mg in addition to other diabetic medications . She reports good compliance with treatment. She is not having side effects.  Current symptoms include none and have been stable. Home blood sugar records: fasting range: 130s  Episodes of hypoglycemia? no   Current Insulin Regimen: none Most Recent Eye Exam: 07/2018 Weight trend: stable Prior visit with dietician: no Current diet: well balanced Current exercise: no regular exercise  Pertinent Labs:    Component Value Date/Time   CHOL 146 04/29/2018 1024   TRIG 84 04/29/2018 1024   HDL 62 04/29/2018 1024   LDLCALC 67 04/29/2018 1024   LDLCALC 69 05/26/2017 0908   CREATININE 1.88 (H) 06/29/2018 1344   CREATININE 1.91 (H) 05/26/2017 0908    Wt Readings from Last 3 Encounters:  08/04/18 234 lb (106.1 kg)  06/29/18 236 lb (107 kg)  05/29/18 240 lb 9.6 oz (109.1 kg)   Patient is a recent widow and is getting ready to move to [place] to retirement village to be closer to her family.  Is likely she feels fairly well.  Emotionally she is doing well.     Allergies  Allergen Reactions  . Amoxicillin-Pot Clavulanate Anaphylaxis and Shortness Of Breath  . Clindamycin Anaphylaxis and Shortness Of Breath    Other reaction(s): Other (See Comments) Other Reaction: Rash; swelling in lips  . Codeine Shortness Of Breath  . Oxycodone Rash and Shortness Of Breath  . Penicillins Anaphylaxis and Shortness Of Breath  . Cefprozil   . Citric Acid  Sodium  [Sodium Citrate]   . Vancomycin Hcl      Current Outpatient Medications:  .  aspirin 81 MG EC tablet, Take 81 mg by mouth every evening. , Disp: , Rfl:  .  Biotin 10000 MCG TABS, Take 1 tablet by mouth daily. , Disp: , Rfl:  .  Cholecalciferol 1000 units tablet, Take 1,000 Units by mouth daily. , Disp: , Rfl:  .  Docusate Sodium 100 MG capsule, Take 100 mg by mouth as needed. , Disp: , Rfl:  .  ezetimibe (ZETIA) 10 MG tablet, Take 1 tablet (10 mg total) by mouth daily., Disp: 30 tablet, Rfl: 12 .  furosemide (LASIX) 40 MG tablet, Take 40 mg by mouth every Monday, Wednesday, and Friday. , Disp: , Rfl:  .  glimepiride (AMARYL) 4 MG tablet, Take 1 tablet (4 mg total) by mouth daily., Disp: 30 tablet, Rfl: 11 .  levothyroxine (SYNTHROID, LEVOTHROID) 88 MCG tablet, TAKE ONE TABLET EVERY DAY, Disp: 30 tablet, Rfl: 12 .  pioglitazone (ACTOS) 30 MG tablet, TAKE ONE TABLET BY MOUTH EVERY DAY, Disp: 90 tablet, Rfl: 3 .  ranitidine (ZANTAC) 300 MG capsule, Take 300 mg by mouth at bedtime as needed for heartburn. , Disp: , Rfl:  .  sodium bicarbonate 650 MG tablet, Take 1,300 mg by mouth 2 (two) times daily. , Disp: , Rfl:   Review of Systems  Constitutional: Negative.   HENT: Negative.   Eyes: Negative.   Respiratory: Negative for cough and shortness of breath.   Cardiovascular: Negative for chest pain, palpitations and leg swelling.  Gastrointestinal: Negative.   Endocrine: Negative for cold intolerance, heat intolerance, polydipsia, polyphagia and polyuria.  Musculoskeletal: Positive for arthralgias.  Allergic/Immunologic: Negative.   Neurological: Negative for dizziness, weakness, light-headedness and headaches.  Psychiatric/Behavioral: Negative.     Social History   Tobacco Use  . Smoking status: Never Smoker  . Smokeless tobacco: Never Used  Substance Use Topics  . Alcohol use: No   Objective:   BP 126/62 (BP Location: Left Arm, Patient Position: Sitting, Cuff Size: Normal)    Pulse 78   Temp 98 F (36.7 C)   Resp 16   Wt 234 lb (106.1 kg)   SpO2 98%   BMI 41.45 kg/m  Vitals:   08/04/18 0911  BP: 126/62  Pulse: 78  Resp: 16  Temp: 98 F (36.7 C)  SpO2: 98%  Weight: 234 lb (106.1 kg)     Physical Exam  Constitutional: She is oriented to person, place, and time. She appears well-developed and well-nourished.  HENT:  Head: Normocephalic and atraumatic.  Right Ear: External ear normal.  Left Ear: External ear normal.  Nose: Nose normal.  Eyes: Conjunctivae are normal. No scleral icterus.  Cardiovascular: Normal rate, regular rhythm and normal heart sounds.  Pulmonary/Chest: Effort normal and breath sounds normal.  Abdominal: Soft.  Musculoskeletal: She exhibits no edema.  Neurological: She is alert and oriented to person, place, and time.  Skin: Skin is warm and dry.  Psychiatric: She has a normal mood and affect. Her behavior is normal. Judgment and thought content normal.        Assessment & Plan:     1. Type 2 diabetes mellitus with diabetic nephropathy, without long-term current use of insulin (HCC) Pt to establish primary care in [place] soon. Will forward records when requested. She is a delightful lady. - POCT glycosylated hemoglobin (Hb A1C)--7.6 today  2. Diabetic peripheral neuropathy associated with type 2 diabetes mellitus ([place])   3. Adult hypothyroidism   4. Malignant neoplasm of kidney, unspecified laterality (HCC)   5. Class 3 severe obesity due to excess calories with serious comorbidity and body mass index (BMI) of 40.0 to 44.9 in adult ([place])   6. Osteoarthritis, unspecified osteoarthritis type, unspecified site 7.CKD Per nephrology.      I have done the exam and reviewed the above chart and it is accurate to the best of my knowledge. Development worker, community has been used in this note in any air is in the dictation or transcription are unintentional.  Wilhemena Durie, MD  [facility]

## 2018-10-18 ENCOUNTER — Other Ambulatory Visit: Payer: Self-pay

## 2018-10-18 ENCOUNTER — Other Ambulatory Visit: Payer: Self-pay | Admitting: Family Medicine

## 2018-10-18 DIAGNOSIS — E785 Hyperlipidemia, unspecified: Secondary | ICD-10-CM

## 2018-10-18 MED ORDER — EZETIMIBE 10 MG PO TABS: 10.0000 mg | ORAL_TABLET | Freq: Every day | ORAL | 12 refills | Status: AC

## 2018-10-18 NOTE — Telephone Encounter (Signed)
[facility] faxed refill request for the following medications:  ezetimibe (ZETIA) 10 MG tablet   Please advise.  

## 2018-10-22 DIAGNOSIS — I509 Heart failure, unspecified: Secondary | ICD-10-CM | POA: Diagnosis not present

## 2018-10-22 DIAGNOSIS — E1165 Type 2 diabetes mellitus with hyperglycemia: Secondary | ICD-10-CM | POA: Diagnosis not present

## 2018-10-25 DIAGNOSIS — E1165 Type 2 diabetes mellitus with hyperglycemia: Secondary | ICD-10-CM | POA: Diagnosis not present

## 2018-10-25 DIAGNOSIS — E7800 Pure hypercholesterolemia, unspecified: Secondary | ICD-10-CM | POA: Diagnosis not present

## 2018-10-25 DIAGNOSIS — I10 Essential (primary) hypertension: Secondary | ICD-10-CM | POA: Diagnosis not present

## 2018-10-25 DIAGNOSIS — E039 Hypothyroidism, unspecified: Secondary | ICD-10-CM | POA: Diagnosis not present

## 2018-10-27 ENCOUNTER — Ambulatory Visit: Payer: Self-pay | Admitting: Family Medicine

## 2019-02-16 DIAGNOSIS — E1165 Type 2 diabetes mellitus with hyperglycemia: Secondary | ICD-10-CM | POA: Diagnosis not present

## 2019-03-23 DIAGNOSIS — E1165 Type 2 diabetes mellitus with hyperglycemia: Secondary | ICD-10-CM | POA: Diagnosis not present

## 2019-04-13 DIAGNOSIS — E1165 Type 2 diabetes mellitus with hyperglycemia: Secondary | ICD-10-CM | POA: Diagnosis not present

## 2019-04-13 DIAGNOSIS — N182 Chronic kidney disease, stage 2 (mild): Secondary | ICD-10-CM | POA: Diagnosis not present

## 2019-04-14 DIAGNOSIS — N182 Chronic kidney disease, stage 2 (mild): Secondary | ICD-10-CM | POA: Diagnosis not present

## 2019-04-14 DIAGNOSIS — E1165 Type 2 diabetes mellitus with hyperglycemia: Secondary | ICD-10-CM | POA: Diagnosis not present

## 2019-05-04 ENCOUNTER — Encounter: Payer: Medicare HMO | Admitting: Family Medicine

## 2019-05-04 ENCOUNTER — Ambulatory Visit: Payer: Medicare HMO

## 2019-05-25 DIAGNOSIS — E1165 Type 2 diabetes mellitus with hyperglycemia: Secondary | ICD-10-CM | POA: Diagnosis not present

## 2019-05-26 DIAGNOSIS — E1165 Type 2 diabetes mellitus with hyperglycemia: Secondary | ICD-10-CM | POA: Diagnosis not present

## 2019-06-08 ENCOUNTER — Other Ambulatory Visit: Payer: Self-pay | Admitting: Family Medicine

## 2019-06-08 MED ORDER — LEVOTHYROXINE SODIUM 88 MCG PO TABS: 88.0000 ug | ORAL_TABLET | Freq: Every day | ORAL | 12 refills | Status: AC

## 2019-06-08 NOTE — Telephone Encounter (Signed)
[facility] faxed refill request for the following medications:  levothyroxine (SYNTHROID, LEVOTHROID) 88 MCG tablet   Please advise.

## 2019-06-09 DIAGNOSIS — R69 Illness, unspecified: Secondary | ICD-10-CM | POA: Diagnosis not present

## 2019-07-27 DIAGNOSIS — E1165 Type 2 diabetes mellitus with hyperglycemia: Secondary | ICD-10-CM | POA: Diagnosis not present

## 2019-08-12 DIAGNOSIS — E1165 Type 2 diabetes mellitus with hyperglycemia: Secondary | ICD-10-CM | POA: Diagnosis not present

## 2019-08-12 DIAGNOSIS — H00024 Hordeolum internum left upper eyelid: Secondary | ICD-10-CM | POA: Diagnosis not present

## 2019-08-12 DIAGNOSIS — H00016 Hordeolum externum left eye, unspecified eyelid: Secondary | ICD-10-CM | POA: Diagnosis not present

## 2019-08-16 DIAGNOSIS — Z1231 Encounter for screening mammogram for malignant neoplasm of breast: Secondary | ICD-10-CM | POA: Diagnosis not present

## 2019-09-07 DIAGNOSIS — E1165 Type 2 diabetes mellitus with hyperglycemia: Secondary | ICD-10-CM | POA: Diagnosis not present

## 2019-10-19 ENCOUNTER — Telehealth: Payer: Self-pay

## 2019-10-19 NOTE — Telephone Encounter (Signed)
Called pt to schedule an AWV and pt states that she has moved to [place] to be closer to her son. Pt is now under the care of another physician in [place]. FYI to PCP!

## 2020-08-16 IMAGING — DX DG CHEST 1V
1 series · 1 of 1 positions shown · non-contrast
Comparison: Frontal and lateral views yesterday.

CLINICAL DATA: Pneumonia.

EXAM:
CHEST  1 VIEW

[chest ap]
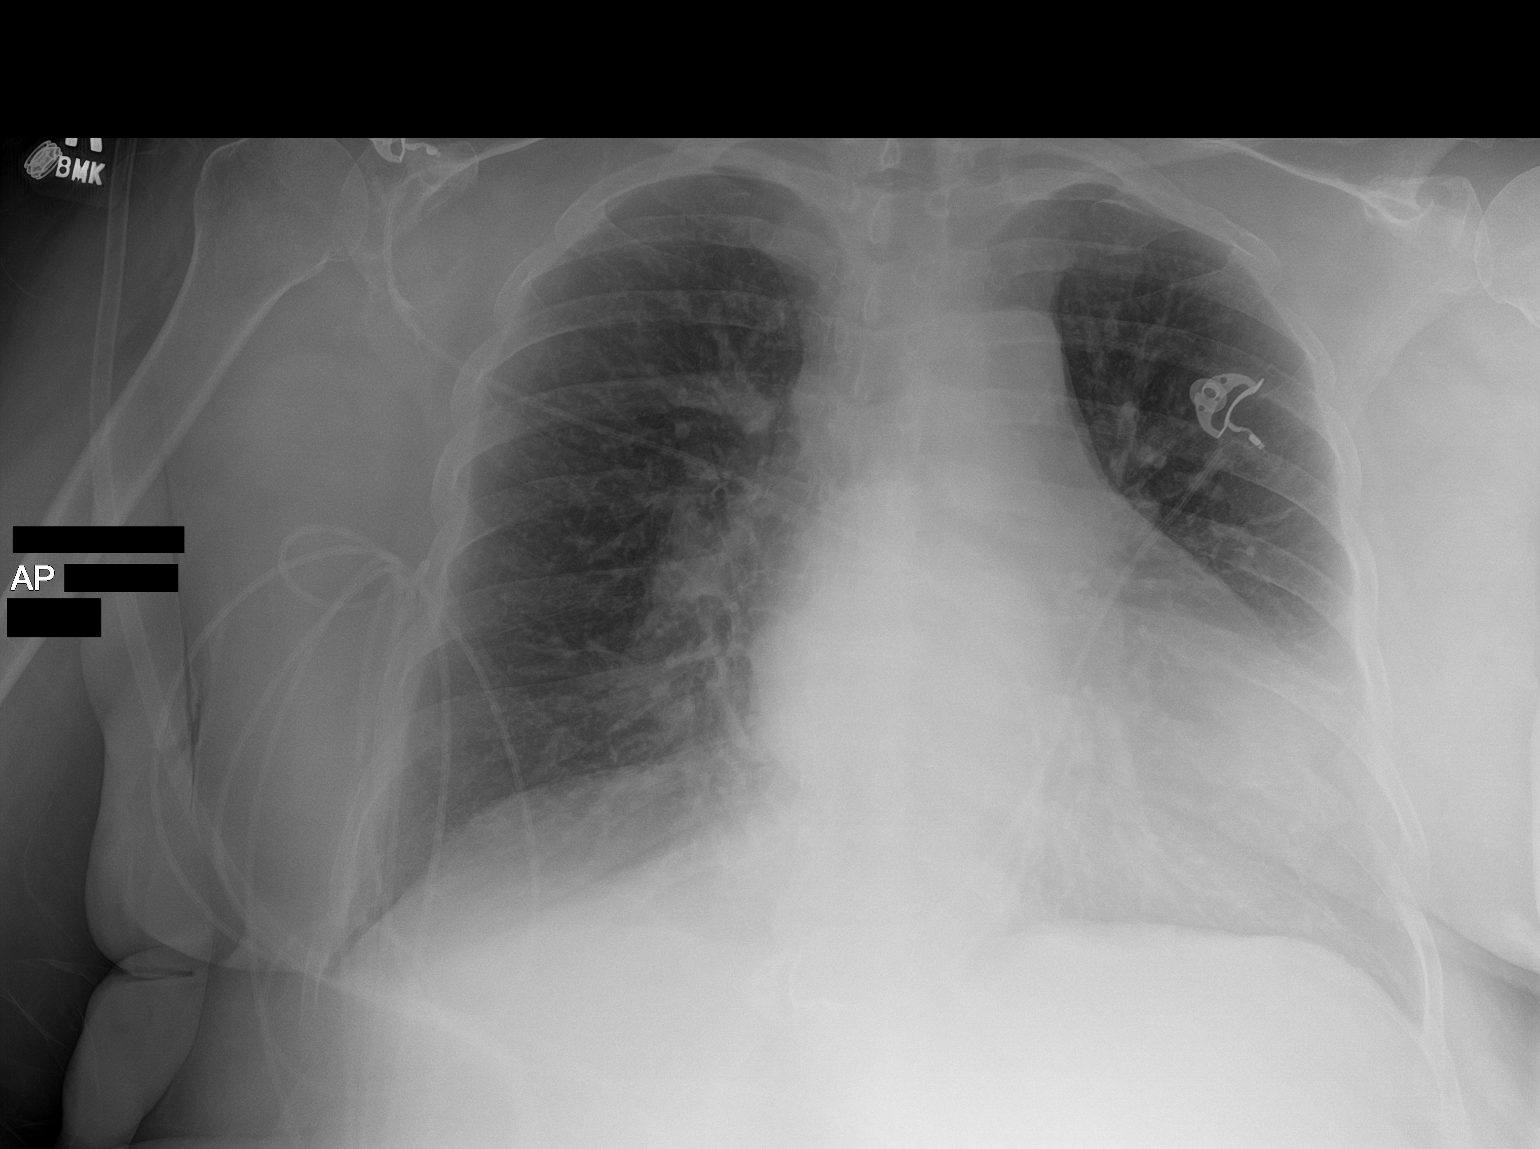

[1 of 1 positions shown; findings below may reference images not displayed]

FINDINGS: Unchanged cardiomegaly and mediastinal contours. Decreased vascular
congestion from prior. Improved bibasilar atelectasis. No new focal
airspace disease. No pleural fluid or pneumothorax. Unchanged
osseous structures.
IMPRESSION: Improved right basilar atelectasis. No focal airspace disease to
suggest pneumonia.
